# Patient Record
Sex: Male | Born: 1935 | Race: White | Hispanic: No | Smoking: Never smoker
Health system: Southern US, Community
[De-identification: ages and names within clinical notes are randomized; demographics above are authoritative.]

## PROBLEM LIST (undated history)

## (undated) DIAGNOSIS — N400 Enlarged prostate without lower urinary tract symptoms: Secondary | ICD-10-CM

## (undated) DIAGNOSIS — F0280 Dementia in other diseases classified elsewhere without behavioral disturbance: Secondary | ICD-10-CM

## (undated) DIAGNOSIS — I639 Cerebral infarction, unspecified: Secondary | ICD-10-CM

## (undated) DIAGNOSIS — G4700 Insomnia, unspecified: Secondary | ICD-10-CM

## (undated) DIAGNOSIS — E7800 Pure hypercholesterolemia, unspecified: Secondary | ICD-10-CM

## (undated) DIAGNOSIS — M1990 Unspecified osteoarthritis, unspecified site: Secondary | ICD-10-CM

## (undated) DIAGNOSIS — E46 Unspecified protein-calorie malnutrition: Secondary | ICD-10-CM

## (undated) DIAGNOSIS — G309 Alzheimer's disease, unspecified: Secondary | ICD-10-CM

## (undated) DIAGNOSIS — Z9181 History of falling: Secondary | ICD-10-CM

## (undated) DIAGNOSIS — E559 Vitamin D deficiency, unspecified: Secondary | ICD-10-CM

## (undated) DIAGNOSIS — I10 Essential (primary) hypertension: Secondary | ICD-10-CM

## (undated) DIAGNOSIS — M810 Age-related osteoporosis without current pathological fracture: Secondary | ICD-10-CM

## (undated) DIAGNOSIS — R410 Disorientation, unspecified: Secondary | ICD-10-CM

## (undated) HISTORY — DX: Disorientation, unspecified: R41.0

## (undated) HISTORY — DX: History of falling: Z91.81

## (undated) HISTORY — PX: JOINT REPLACEMENT: SHX530

## (undated) HISTORY — DX: Essential (primary) hypertension: I10

## (undated) HISTORY — DX: Vitamin D deficiency, unspecified: E55.9

## (undated) HISTORY — DX: Unspecified protein-calorie malnutrition: E46

---

## 1957-10-28 HISTORY — PX: NEPHRECTOMY: SHX65

## 1998-11-16 ENCOUNTER — Inpatient Hospital Stay (HOSPITAL_COMMUNITY): Admission: RE | Admit: 1998-11-16 | Discharge: 1998-11-22 | Payer: Self-pay | Admitting: Orthopedic Surgery

## 1999-02-28 ENCOUNTER — Ambulatory Visit (HOSPITAL_BASED_OUTPATIENT_CLINIC_OR_DEPARTMENT_OTHER): Admission: RE | Admit: 1999-02-28 | Discharge: 1999-02-28 | Payer: Self-pay | Admitting: Urology

## 2000-11-24 ENCOUNTER — Ambulatory Visit (HOSPITAL_COMMUNITY): Admission: RE | Admit: 2000-11-24 | Discharge: 2000-11-24 | Payer: Self-pay | Admitting: *Deleted

## 2003-12-28 ENCOUNTER — Ambulatory Visit (HOSPITAL_COMMUNITY): Admission: RE | Admit: 2003-12-28 | Discharge: 2003-12-28 | Payer: Self-pay | Admitting: *Deleted

## 2006-02-26 ENCOUNTER — Encounter: Payer: Self-pay | Admitting: Orthopedic Surgery

## 2011-03-27 ENCOUNTER — Ambulatory Visit (HOSPITAL_COMMUNITY): Payer: Medicare Other | Attending: Internal Medicine

## 2011-03-27 DIAGNOSIS — M810 Age-related osteoporosis without current pathological fracture: Secondary | ICD-10-CM | POA: Insufficient documentation

## 2012-07-07 ENCOUNTER — Other Ambulatory Visit: Payer: Self-pay | Admitting: Orthopedic Surgery

## 2012-07-07 DIAGNOSIS — R0989 Other specified symptoms and signs involving the circulatory and respiratory systems: Secondary | ICD-10-CM

## 2012-07-09 ENCOUNTER — Ambulatory Visit
Admission: RE | Admit: 2012-07-09 | Discharge: 2012-07-09 | Disposition: A | Discharge status: Home / Self Care | Payer: Medicare Other | Source: Ambulatory Visit | Attending: Orthopedic Surgery | Admitting: Orthopedic Surgery

## 2012-07-09 DIAGNOSIS — R0989 Other specified symptoms and signs involving the circulatory and respiratory systems: Secondary | ICD-10-CM

## 2012-07-24 ENCOUNTER — Other Ambulatory Visit (HOSPITAL_COMMUNITY): Payer: Self-pay | Admitting: *Deleted

## 2012-07-28 ENCOUNTER — Encounter (HOSPITAL_COMMUNITY): Payer: Self-pay

## 2012-07-28 ENCOUNTER — Encounter (HOSPITAL_COMMUNITY)
Admission: RE | Admit: 2012-07-28 | Discharge: 2012-07-28 | Disposition: A | Discharge status: Home / Self Care | Payer: Medicare Other | Source: Ambulatory Visit | Attending: Internal Medicine | Admitting: Internal Medicine

## 2012-07-28 DIAGNOSIS — M810 Age-related osteoporosis without current pathological fracture: Secondary | ICD-10-CM | POA: Insufficient documentation

## 2012-07-28 HISTORY — DX: Insomnia, unspecified: G47.00

## 2012-07-28 HISTORY — DX: Pure hypercholesterolemia, unspecified: E78.00

## 2012-07-28 HISTORY — DX: Age-related osteoporosis without current pathological fracture: M81.0

## 2012-07-28 MED ORDER — SODIUM CHLORIDE 0.9 % IV SOLN
Freq: Once | INTRAVENOUS | Status: AC
  Administered 2012-07-28: 13:00:00 via INTRAVENOUS

## 2012-07-28 MED ORDER — ZOLEDRONIC ACID 5 MG/100ML IV SOLN
5.0000 mg | Freq: Once | INTRAVENOUS | Status: AC
  Administered 2012-07-28: 5 mg via INTRAVENOUS
  Filled 2012-07-28: qty 100

## 2012-08-30 ENCOUNTER — Other Ambulatory Visit: Payer: Self-pay

## 2012-08-30 ENCOUNTER — Inpatient Hospital Stay (HOSPITAL_COMMUNITY)
Admission: EM | Admit: 2012-08-30 | Discharge: 2012-09-02 | DRG: 065 | Disposition: A | Discharge status: Skilled Nursing Facility | Payer: Medicare Other | Attending: Internal Medicine | Admitting: Internal Medicine

## 2012-08-30 ENCOUNTER — Emergency Department (HOSPITAL_COMMUNITY): Payer: Medicare Other

## 2012-08-30 DIAGNOSIS — I6350 Cerebral infarction due to unspecified occlusion or stenosis of unspecified cerebral artery: Principal | ICD-10-CM | POA: Diagnosis present

## 2012-08-30 DIAGNOSIS — G4700 Insomnia, unspecified: Secondary | ICD-10-CM | POA: Diagnosis present

## 2012-08-30 DIAGNOSIS — Z96659 Presence of unspecified artificial knee joint: Secondary | ICD-10-CM

## 2012-08-30 DIAGNOSIS — E785 Hyperlipidemia, unspecified: Secondary | ICD-10-CM | POA: Diagnosis present

## 2012-08-30 DIAGNOSIS — E46 Unspecified protein-calorie malnutrition: Secondary | ICD-10-CM | POA: Diagnosis present

## 2012-08-30 DIAGNOSIS — R531 Weakness: Secondary | ICD-10-CM

## 2012-08-30 DIAGNOSIS — G309 Alzheimer's disease, unspecified: Secondary | ICD-10-CM | POA: Diagnosis present

## 2012-08-30 DIAGNOSIS — M899 Disorder of bone, unspecified: Secondary | ICD-10-CM | POA: Diagnosis present

## 2012-08-30 DIAGNOSIS — E559 Vitamin D deficiency, unspecified: Secondary | ICD-10-CM | POA: Diagnosis present

## 2012-08-30 DIAGNOSIS — F0280 Dementia in other diseases classified elsewhere without behavioral disturbance: Secondary | ICD-10-CM | POA: Diagnosis present

## 2012-08-30 DIAGNOSIS — R4182 Altered mental status, unspecified: Secondary | ICD-10-CM

## 2012-08-30 DIAGNOSIS — N400 Enlarged prostate without lower urinary tract symptoms: Secondary | ICD-10-CM | POA: Diagnosis present

## 2012-08-30 DIAGNOSIS — M1710 Unilateral primary osteoarthritis, unspecified knee: Secondary | ICD-10-CM | POA: Diagnosis present

## 2012-08-30 DIAGNOSIS — Z66 Do not resuscitate: Secondary | ICD-10-CM | POA: Diagnosis present

## 2012-08-30 DIAGNOSIS — I10 Essential (primary) hypertension: Secondary | ICD-10-CM | POA: Diagnosis present

## 2012-08-30 HISTORY — DX: Unspecified osteoarthritis, unspecified site: M19.90

## 2012-08-30 LAB — CBC
HCT: 42.5 % (ref 39.0–52.0)
MCV: 92.6 fL (ref 78.0–100.0)
RBC: 4.59 MIL/uL (ref 4.22–5.81)
RDW: 13 % (ref 11.5–15.5)
WBC: 10.6 10*3/uL — ABNORMAL HIGH (ref 4.0–10.5)

## 2012-08-30 LAB — BASIC METABOLIC PANEL
BUN: 14 mg/dL (ref 6–23)
CO2: 23 mEq/L (ref 19–32)
Chloride: 97 mEq/L (ref 96–112)
Creatinine, Ser: 0.82 mg/dL (ref 0.50–1.35)
GFR calc Af Amer: 87 mL/min — ABNORMAL LOW (ref >90)
Potassium: 3.5 mEq/L (ref 3.5–5.1)

## 2012-08-30 LAB — GLUCOSE, CAPILLARY: Glucose-Capillary: 108 mg/dL — ABNORMAL HIGH (ref 70–99)

## 2012-08-30 LAB — URINALYSIS, ROUTINE W REFLEX MICROSCOPIC
Glucose, UA: NEGATIVE mg/dL
Ketones, ur: 15 mg/dL — AB
Leukocytes, UA: NEGATIVE
Nitrite: NEGATIVE
Specific Gravity, Urine: 1.021 (ref 1.005–1.030)
pH: 6 (ref 5.0–8.0)

## 2012-08-30 LAB — RAPID URINE DRUG SCREEN, HOSP PERFORMED: Barbiturates: NOT DETECTED

## 2012-08-30 MED ORDER — SODIUM CHLORIDE 0.9 % IV BOLUS (SEPSIS)
1000.0000 mL | Freq: Once | INTRAVENOUS | Status: AC
  Administered 2012-08-31: 1000 mL via INTRAVENOUS

## 2012-08-30 MED ORDER — SODIUM CHLORIDE 0.9 % IV SOLN
INTRAVENOUS | Status: DC
  Administered 2012-08-31: 1000 mL via INTRAVENOUS
  Administered 2012-09-01 – 2012-09-02 (×2): via INTRAVENOUS

## 2012-08-30 NOTE — ED Notes (Signed)
ZOX:[id number]<AV> Expected date:08/30/12<BR> Expected time: 7:55 PM<BR> Means of arrival:Ambulance<BR> Comments:<BR> Found outside Altered LOC 78 yo M

## 2012-08-30 NOTE — ED Notes (Signed)
Pt BIB EMS. Pt found in front yard laying down for unknown amount of time. Bystander called EMS. Pt a/o x 3, but doesn't know why he is outside per EMS. No neuro deficits per EMS. Pt has had some episodes of hallucinations since Wed. Pt has been seeing ants on the wall and having conversations with deceased family members. Pt lives alone. Pt arrives with son.

## 2012-08-30 NOTE — ED Notes (Signed)
MD at bedside. Dr. Bednar at bedside.  

## 2012-08-30 NOTE — ED Provider Notes (Signed)
History     CSN: [id number]  Arrival date & time 08/30/12  2033   First MD Initiated Contact with Patient 08/30/12 2302      Chief Complaint  Patient presents with  . Altered Mental Status   PCP: [facility]  (Consider location/radiation/quality/duration/timing/severity/associated sxs/prior treatment) HPI This 78 year old male lives with family at home, at baseline the patient has generalized weakness and uses a walker, for the last few weeks he is gradually become weaker than usual and has had several falls with no apparent trauma, over the last 5 days he has had new onset confusion with intermittent hallucinations and delusions thinking that people are alive and bugs are crawling on the wall when they're not there, he is no suicidal or homicidal ideation or depression, he is no headache, he is no change in vision swallowing or understanding, he is no lateralizing weakness or numbness or incoordination, he is no cough shortness breath chest pain abdominal pain vomiting or diarrhea. Today his son was at work and came home and the patient was found outside and had been outside for an unknown period of time and the patient had amnesia for what had gone on today. He denies any pain. Tonight while in the emergency department the patient has been generally weak but at 2300 apparently had clear speech prior to his CT scan of his head, upon return back to the room the patient seems to have slightly slurred speech according to the family, but has no headache no change in mental status he is oriented to person and place but not to time but he has been disoriented to time for the last 4 or 5 days now, he still has no lateralizing or focal neurologic deficits.  Since the patient was found earlier today he has been too weak to walk on his own. Past Medical History  Diagnosis Date  . High cholesterol   . Insomnia   . Osteoporosis   . Osteoarthritis     Past Surgical History  Procedure Date  . Nephrectomy  1959    rt, but doesn't remember why  . Joint replacement     left knee    History reviewed. No pertinent family history.  History  Substance Use Topics  . Smoking status: Never Smoker   . Smokeless tobacco: Never Used  . Alcohol Use: No      Review of Systems 10 Systems reviewed and are negative for acute change except as noted in the HPI. Allergies  Review of patient's allergies indicates no known allergies.  Home Medications   No current outpatient prescriptions on file.  BP 130/79  Pulse 85  Temp 97.7 F (36.5 C) (Axillary)  Resp 18  Ht 5\' 4"  (1.626 m)  Wt 124 lb 8 oz (56.473 kg)  BMI 21.37 kg/m2  SpO2 95%  Physical Exam  Nursing note and vitals reviewed. Constitutional:       Awake, alert, nontoxic appearance with speech slightly slurred for patient.  HENT:  Head: Atraumatic.  Mouth/Throat: No oropharyngeal exudate.  Eyes: EOM are normal. Pupils are equal, round, and reactive to light. Right eye exhibits no discharge. Left eye exhibits no discharge.  Neck: Neck supple.  Cardiovascular: Normal rate and regular rhythm.   No murmur heard. Pulmonary/Chest: Effort normal and breath sounds normal. No stridor. No respiratory distress. He has no wheezes. He has no rales. He exhibits no tenderness.  Abdominal: Soft. Bowel sounds are normal. He exhibits no mass. There is no tenderness. There is no  rebound.  Musculoskeletal: He exhibits no tenderness.       Baseline ROM, moves extremities with no obvious new focal weakness.  Lymphadenopathy:    He has no cervical adenopathy.  Neurological: He is alert.       Awake, alert, cooperative oriented to person and place but not time; motor strength 4/5 bilaterally; sensation normal to light touch bilaterally; peripheral visual fields full to confrontation; no facial asymmetry; tongue midline; major cranial nerves appear intact; no pronator drift arms or legs, normal finger to nose bilaterally, too weak to sit unassisted or  stand  Skin: No rash noted.  Psychiatric: He has a normal mood and affect.    ED Course  Procedures (including critical care time) ECG: Sinus tachycardia, ventricular 108, left axis deviation, left anterior fascicular block, premature ventricular complexes, no acute ischemic changes noted, compared with 2000 axis is shifted leftward  D/w family cannot r/o CVA with minimal dysarthria vs dry mouth with speech almost normal after CT resulted, family does not want [facility] or [facility] even if CVA, family comfortable with and prefers admit at [facility] by PCP. 0040  D/w Perini for admit.  Family states Pt would want DNR/DNI (Pt sleeping now).0140 Labs Reviewed  CBC - Abnormal; Notable for the following:    WBC 10.6 (*)     All other components within normal limits  BASIC METABOLIC PANEL - Abnormal; Notable for the following:    Sodium 132 (*)     Glucose, Bld 105 (*)     GFR calc non Af Amer 75 (*)     GFR calc Af Amer 87 (*)     All other components within normal limits  URINALYSIS, ROUTINE W REFLEX MICROSCOPIC - Abnormal; Notable for the following:    Ketones, ur 15 (*)     All other components within normal limits  HEPATIC FUNCTION PANEL - Abnormal; Notable for the following:    Albumin 3.1 (*)     All other components within normal limits  GLUCOSE, CAPILLARY - Abnormal; Notable for the following:    Glucose-Capillary 108 (*)     All other components within normal limits  LIPID PANEL - Abnormal; Notable for the following:    LDL Cholesterol 100 (*)     All other components within normal limits  PROTIME-INR  APTT  TROPONIN I  URINE RAPID DRUG SCREEN (HOSP PERFORMED)  ETHANOL  LACTIC ACID, PLASMA  TSH  VITAMIN B12  RPR  HEMOGLOBIN A1C  COMPREHENSIVE METABOLIC PANEL  CBC   Ct Head Wo Contrast  08/31/2012  *RADIOLOGY REPORT*  Clinical Data: Altered mental status.  CT HEAD WITHOUT CONTRAST  Technique:  Contiguous axial images were obtained from the base of the skull through the  vertex without contrast.  Comparison: None.  Findings: There is no evidence of acute infarction, mass lesion, or intra- or extra-axial hemorrhage on CT.  Prominence of the ventricles and sulci reflects moderate cortical volume loss.  Cerebellar atrophy is noted.  Diffuse periventricular and subcortical white matter change likely reflects small vessel ischemic microangiopathy.  The brainstem and fourth ventricle are within normal limits.  The basal ganglia are unremarkable in appearance.  The cerebral hemispheres demonstrate grossly normal gray-white differentiation. No mass effect or midline shift is seen.  There is no evidence of fracture; visualized osseous structures are unremarkable in appearance.  The orbits are within normal limits. The paranasal sinuses and mastoid air cells are well-aerated.  No significant soft tissue abnormalities are seen.  IMPRESSION:  1.  No acute intracranial pathology seen on CT. 2.  Moderate cortical volume loss and diffuse small vessel ischemic microangiopathy.   Original Report Authenticated By: Tonia Ghent, M.D.    Mr Brain Wo Contrast  08/31/2012  *RADIOLOGY REPORT*  Clinical Data: Altered mental status.  Weakness.  Hyperglycemia.  MRI HEAD WITHOUT CONTRAST  Technique:  Multiplanar, multiecho pulse sequences of the brain and surrounding structures were obtained according to standard protocol without intravenous contrast.  Comparison: Head CT same day  Findings: There is a punctate acute infarction within the right posteromedial temporal lobe.  No other acute infarction.  There are extensive chronic small vessel changes affecting the pons.  There are old small vessel infarctions within the cerebellum.  The cerebral hemispheres show generalized atrophy with patchy and confluent chronic small vessel disease throughout the white matter. No large vessel territory infarction.  No mass lesion, hemorrhage, hydrocephalus or extra-axial collection.  No pituitary mass.  No inflammatory  sinus disease.  No skull or skull base lesion.  IMPRESSION: Punctate acute infarction within the posterior temporal lobe on the right.  Advanced and extensive chronic small vessel disease elsewhere throughout the brain.   Original Report Authenticated By: Paulina Fusi, M.D.    Dg Chest Port 1 View  08/30/2012  *RADIOLOGY REPORT*  Clinical Data: Acute mental status changes.  Hyperglycemia. Leukocytosis.  PORTABLE CHEST - 1 VIEW 08/30/2012 2336 hours:  Comparison: None.  Findings: Cardiac silhouette enlarged, even allowing for the AP portable technique.  Thoracic aorta mildly atherosclerotic.  Hilar and mediastinal contours otherwise unremarkable.  Linear scar or atelectasis at the left lung base.  Lungs otherwise clear.  No visible pleural effusions.  IMPRESSION: Mild cardiomegaly.  Linear atelectasis or scar at the left lung base.  No acute cardiopulmonary disease otherwise.   Original Report Authenticated By: Hulan Saas, M.D.      1. Altered mental state   2. Weakness       MDM  Pt stable in ED with no significant deterioration in condition.  Patient / Family / Caregiver informed of clinical course, understand medical decision-making process, and agree with plan.       Hurman Horn, MD 08/31/12 [phone]

## 2012-08-30 NOTE — ED Notes (Signed)
Pt alert, oriented. Pt confused about situation today. Family members at bedside. Pt denies pain. Responds appropriately to questions.

## 2012-08-30 NOTE — ED Notes (Signed)
Patient receiving portable  X-ray

## 2012-08-31 ENCOUNTER — Encounter (HOSPITAL_COMMUNITY): Payer: Self-pay

## 2012-08-31 ENCOUNTER — Inpatient Hospital Stay (HOSPITAL_COMMUNITY): Payer: Medicare Other

## 2012-08-31 DIAGNOSIS — R4182 Altered mental status, unspecified: Secondary | ICD-10-CM

## 2012-08-31 DIAGNOSIS — I6350 Cerebral infarction due to unspecified occlusion or stenosis of unspecified cerebral artery: Principal | ICD-10-CM

## 2012-08-31 LAB — PROTIME-INR: INR: 0.99 (ref 0.00–1.49)

## 2012-08-31 LAB — TROPONIN I: Troponin I: <0.3 ng/mL (ref <0.30)

## 2012-08-31 LAB — LIPID PANEL
LDL Cholesterol: 100 mg/dL — ABNORMAL HIGH (ref 0–99)
Total CHOL/HDL Ratio: 3.2 RATIO
VLDL: 12 mg/dL (ref 0–40)

## 2012-08-31 LAB — ETHANOL: Alcohol, Ethyl (B): <11 mg/dL (ref 0–11)

## 2012-08-31 LAB — HEPATIC FUNCTION PANEL
ALT: 12 U/L (ref 0–53)
AST: 18 U/L (ref 0–37)
Albumin: 3.1 g/dL — ABNORMAL LOW (ref 3.5–5.2)
Total Protein: 6.2 g/dL (ref 6.0–8.3)

## 2012-08-31 LAB — APTT: aPTT: 31 seconds (ref 24–37)

## 2012-08-31 LAB — LACTIC ACID, PLASMA: Lactic Acid, Venous: 1.3 mmol/L (ref 0.5–2.2)

## 2012-08-31 LAB — TSH: TSH: 4.109 u[IU]/mL (ref 0.350–4.500)

## 2012-08-31 MED ORDER — SODIUM CHLORIDE 0.9 % IV SOLN: 250.0000 mL | INTRAVENOUS | Status: DC | PRN

## 2012-08-31 MED ORDER — ASPIRIN 325 MG PO TABS
325.0000 mg | ORAL_TABLET | Freq: Every day | ORAL | Status: DC
  Filled 2012-08-31: qty 1

## 2012-08-31 MED ORDER — SODIUM CHLORIDE 0.9 % IJ SOLN: 3.0000 mL | Freq: Two times a day (BID) | INTRAMUSCULAR | Status: DC

## 2012-08-31 MED ORDER — ACETAMINOPHEN 325 MG PO TABS
650.0000 mg | ORAL_TABLET | Freq: Four times a day (QID) | ORAL | Status: DC | PRN
  Administered 2012-08-31: 650 mg via ORAL
  Filled 2012-08-31: qty 2

## 2012-08-31 MED ORDER — SODIUM CHLORIDE 0.9 % IJ SOLN: 3.0000 mL | INTRAMUSCULAR | Status: DC | PRN

## 2012-08-31 MED ORDER — DEXTROSE-NACL 5-0.45 % IV SOLN
INTRAVENOUS | Status: DC
  Administered 2012-08-31: 15:00:00 via INTRAVENOUS

## 2012-08-31 MED ORDER — DOXAZOSIN MESYLATE 4 MG PO TABS
4.0000 mg | ORAL_TABLET | Freq: Every day | ORAL | Status: DC
  Administered 2012-09-01: 4 mg via ORAL
  Filled 2012-08-31 (×3): qty 1

## 2012-08-31 MED ORDER — ONDANSETRON HCL 4 MG PO TABS: 4.0000 mg | ORAL_TABLET | Freq: Four times a day (QID) | ORAL | Status: DC | PRN

## 2012-08-31 MED ORDER — SENNOSIDES-DOCUSATE SODIUM 8.6-50 MG PO TABS
1.0000 | ORAL_TABLET | Freq: Every evening | ORAL | Status: DC | PRN
  Filled 2012-08-31: qty 1

## 2012-08-31 MED ORDER — ZOLPIDEM TARTRATE 5 MG PO TABS
5.0000 mg | ORAL_TABLET | Freq: Every evening | ORAL | Status: DC | PRN
  Administered 2012-08-31: 5 mg via ORAL
  Filled 2012-08-31: qty 1

## 2012-08-31 MED ORDER — MEMANTINE HCL 5 MG PO TABS
5.0000 mg | ORAL_TABLET | Freq: Every day | ORAL | Status: DC
  Administered 2012-08-31 – 2012-09-02 (×3): 5 mg via ORAL
  Filled 2012-08-31 (×3): qty 1

## 2012-08-31 MED ORDER — INFLUENZA VIRUS VACC SPLIT PF IM SUSP
0.5000 mL | INTRAMUSCULAR | Status: AC
  Administered 2012-09-01: 0.5 mL via INTRAMUSCULAR
  Filled 2012-08-31: qty 0.5

## 2012-08-31 MED ORDER — ATORVASTATIN CALCIUM 20 MG PO TABS
20.0000 mg | ORAL_TABLET | Freq: Every day | ORAL | Status: DC
  Administered 2012-08-31 – 2012-09-02 (×3): 20 mg via ORAL
  Filled 2012-08-31 (×3): qty 1

## 2012-08-31 MED ORDER — LORAZEPAM 2 MG/ML IJ SOLN
1.0000 mg | Freq: Once | INTRAMUSCULAR | Status: AC
  Administered 2012-08-31: 1 mg via INTRAVENOUS
  Filled 2012-08-31: qty 1

## 2012-08-31 MED ORDER — ASPIRIN EC 81 MG PO TBEC
81.0000 mg | DELAYED_RELEASE_TABLET | Freq: Every day | ORAL | Status: DC
  Administered 2012-09-01 – 2012-09-02 (×2): 81 mg via ORAL
  Filled 2012-08-31 (×2): qty 1

## 2012-08-31 MED ORDER — ONDANSETRON HCL 4 MG/2ML IJ SOLN: 4.0000 mg | Freq: Four times a day (QID) | INTRAMUSCULAR | Status: DC | PRN

## 2012-08-31 MED ORDER — ALUM & MAG HYDROXIDE-SIMETH 200-200-20 MG/5ML PO SUSP
30.0000 mL | Freq: Four times a day (QID) | ORAL | Status: DC | PRN
Start: 2012-08-31 — End: 2012-09-02

## 2012-08-31 MED ORDER — DEXTROSE-NACL 5-0.45 % IV SOLN
INTRAVENOUS | Status: AC
  Administered 2012-08-31: 03:00:00 via INTRAVENOUS

## 2012-08-31 MED ORDER — ACETAMINOPHEN 650 MG RE SUPP: 650.0000 mg | Freq: Four times a day (QID) | RECTAL | Status: DC | PRN

## 2012-08-31 NOTE — Progress Notes (Signed)
08-31-12-0315  Pt has just arrived on floor and it was reported to me from ED nurse that pt failed his swallowing study and should be made NPO however order in computer is heart healthy.  I will keep pt NPO until MD clarifies in AM.  Please also clarify IVF there are 2 in computer at this time and I have the latest one hanging.  D5% 1/2ns at 100 cc/hr.  Pt remains very pleasantly confused, trying to follow commands, unable to answer a direct question, answers do not match the conversation, PERRLA, grips and pedal pushes are equal, no slurred speech, vss, bed alarm is on.  Will continue to monitor.

## 2012-08-31 NOTE — Progress Notes (Signed)
Clinical Social Work Department BRIEF PSYCHOSOCIAL ASSESSMENT 08/31/2012  Patient:  Craig White, Craig White     Account Number:  [id number]     Admit date:  08/30/2012  Clinical Social Worker:  Orpah Greek  Date/Time:  08/31/2012 03:13 PM  Referred by:  Physician  Date Referred:  08/31/2012 Referred for  SNF Placement   Other Referral:   Interview type:  Family Other interview type:    PSYCHOSOCIAL DATA Living Status:  FAMILY Admitted from facility:   Level of care:   Primary support name:  Jerilee Field (daughter) h#: [phone] c#: [phone] Primary support relationship to patient:  CHILD, ADULT Degree of support available:   good    CURRENT CONCERNS Current Concerns  Post-Acute Placement   Other Concerns:    SOCIAL WORK ASSESSMENT / PLAN CSW spoke with patient's daughter, Harriett Sine & son-in-law, Ronnie at bedside re: discharge planning. Patient had been living with his son, [name] who works during the day leaving patient alone. Patient is needing SNF/Memory care unit.   Assessment/plan status:  Information/Referral to [facility] Other assessment/ plan:   Information/referral to community resources:   CSW completed FL2 and faxed information out to [facility] - daughter expressed interest in [facility] [place]. CSW made [name] @ [facility] aware, awaiting call back from her re: bed offer/availability.    PATIENT'S/FAMILY'S RESPONSE TO PLAN OF CARE: Patient's family were agreeable with plan for SNF/Memory care unit.        Unice Bailey, LCSW [facility] Clinical Social Worker cell #: [phone]

## 2012-08-31 NOTE — Progress Notes (Signed)
08-31-12 1610 NSG:  Noted that Dr Wylene Simmer has just placed some orders for this pt - I needed some clarification re: diet, IVF and code status and called MD on call who was Dr Waynard Edwards and explained situation.  He said to keept pt NPO until Dr Wylene Simmer comes in, continue D5 1/2 NS at 100 and full code status until Dr Wylene Simmer addresses.  And states Dr Wylene Simmer cell number today is [phone] - states Dr Wylene Simmer is probably coming in very soon but if he doesn't come in by ( then call him for clarification of these issues)

## 2012-08-31 NOTE — Consult Note (Signed)
NEURO HOSPITALIST CONSULT NOTE    Reason for Consult:  Speech deficit  HPI:                                                                                                                                          Craig White is an 78 y.o. male Who has history of dementia, lives with his son.  He was last seen normal yesterday at 11 am before his son left for work.  When his sone returned from work he was found on the side of the  Street confused and generally weak. He was also noted to have expressive difficulties and slurred speech.  This improved slightly by the time they arrived at the hospital but while in CT scanner his speech again became dysarthric. Over night his speech has resolved and did very well with speech therapy.  He had no lateralizing weakness.   Of note: 1 month ago patient was started on Amitriptyline for pain.  About one week ago patient was noted to have visual hallucinations --more than his baseline. As patient has a known Dx of dementia, the initiation of this drug likely worsened his symptoms of dementia.   During exam patient is alert but not oriented to date, year, hospital or even his own birth date.  He can recall 8 vegetables in 1 minute, can spell WORLD forward but only get DL--- backward.  No problems counting 25+10+5+1 = 41. Able to name objects.   Past Medical History  Diagnosis Date  . High cholesterol   . Insomnia   . Osteoporosis   . Osteoarthritis     Past Surgical History  Procedure Date  . Nephrectomy 1959    rt, but doesn't remember why  . Joint replacement     left knee    Family History: Mother stroke Father: died when patient was 12 Siblings--Alzhiemers, broken hip, cancer, stroke  Social History:  reports that he has never smoked. He has never used smokeless tobacco. He reports that he does not drink alcohol or use illicit drugs.  No Known Allergies  MEDICATIONS:  Prior to Admission:  Prescriptions prior to admission  Medication Sig Dispense Refill  . amitriptyline (ELAVIL) 10 MG tablet Take 10 mg by mouth at bedtime.      Marland Kitchen atorvastatin (LIPITOR) 20 MG tablet Take 20 mg by mouth daily.      . Calcium-Vitamin D (CALTRATE 600 PLUS-VIT D PO) Take by mouth.      . doxazosin (CARDURA) 4 MG tablet Take 4 mg by mouth at bedtime.      . ergocalciferol (VITAMIN D2) 50000 UNITS capsule Take 50,000 Units by mouth once a week.      . hydrochlorothiazide (HYDRODIURIL) 12.5 MG tablet Take 12.5 mg by mouth daily.      . zoledronic acid (RECLAST) 5 MG/100ML SOLN Inject 5 mg into the vein once.       Scheduled:   . aspirin  325 mg Oral Daily  . atorvastatin  20 mg Oral Daily  . [COMPLETED] dextrose 5 % and 0.45% NaCl   Intravenous STAT  . doxazosin  4 mg Oral QHS  . influenza  inactive virus vaccine  0.5 mL Intramuscular Tomorrow-1000  . [COMPLETED] LORazepam  1 mg Intravenous Once  . [COMPLETED] sodium chloride  1,000 mL Intravenous Once  . [DISCONTINUED] sodium chloride  3 mL Intravenous Q12H  . [DISCONTINUED] sodium chloride  3 mL Intravenous Q12H     ROS:                                                                                                                                       History obtained from child and the patient  General ROS: negative for - chills, fatigue, fever, night sweats, weight gain or weight loss Psychological ROS: negative for - behavioral disorder, hallucinations, memory difficulties, mood swings or suicidal ideation Ophthalmic ROS: negative for - blurry vision, double vision, eye pain or loss of vision ENT ROS: negative for - epistaxis, nasal discharge, oral lesions, sore throat, tinnitus or vertigo Allergy and Immunology ROS: negative for - hives or itchy/watery eyes Hematological and Lymphatic ROS: negative for - bleeding problems, bruising or swollen lymph  nodes Endocrine ROS: negative for - galactorrhea, hair pattern changes, polydipsia/polyuria or temperature intolerance Respiratory ROS: negative for - cough, hemoptysis, shortness of breath or wheezing Cardiovascular ROS: negative for - chest pain, dyspnea on exertion, edema or irregular heartbeat Gastrointestinal ROS: negative for - abdominal pain, diarrhea, hematemesis, nausea/vomiting or stool incontinence Genito-Urinary ROS: negative for - dysuria, hematuria, incontinence or urinary frequency/urgency Musculoskeletal ROS: See HPI Neurological ROS: as noted in HPI Dermatological ROS: negative for rash and skin lesion changes   Blood pressure 130/79, pulse 85, temperature 97.7 F (36.5 C), temperature source Axillary, resp. rate 18, height 5\' 4"  (1.626 m), weight 56.473 kg (124 lb 8 oz), SpO2 95.00%.   Neurologic Examination:  Mental Status: During exam patient is alert but not oriented to date, year, hospital or even his own birth date.  He can recall 8 vegetables in 1 minute, can spell WORLD forward but only get DL--- backward.  No problems counting 25+10+5+1 = 41. Able to name objects. Unable to interpret proverbs.  Cranial Nerves: II: Discs flat bilaterally; Visual fields grossly normal, pupils equal, round, reactive to light and accommodation III,IV, VI: ptosis not present, extra-ocular motions intact bilaterally V,VII: smile symmetric, facial light touch sensation normal bilaterally VIII: hearing severely decreased at baseline IX,X: gag reflex present XI: bilateral shoulder shrug XII: midline tongue extension Motor: 4/5 throughgout with slightly weaker in right leg due to right knee arthritis.   Tone and bulk:normal tone throughout; no atrophy noted Sensory: Pinprick and light touch intact throughout, bilaterally Deep Tendon Reflexes: 2+ UE bilaterally, 1+ KJ bilaterally, depressed  AJ bilaterally Plantars: Right: downgoing   Left: downgoing Cerebellar: normal finger-to-nose,   CV: pulses palpable throughout     Lab Results  Component Value Date/Time   CHOL 164 08/31/2012  6:43 AM    Results for orders placed during the hospital encounter of 08/30/12 (from the past 48 hour(s))  CBC     Status: Abnormal   Collection Time   08/30/12  9:32 PM      Component Value Range Comment   WBC 10.6 (*) 4.0 - 10.5 K/uL    RBC 4.59  4.22 - 5.81 MIL/uL    Hemoglobin 14.5  13.0 - 17.0 g/dL    HCT 16.1  09.6 - 04.5 %    MCV 92.6  78.0 - 100.0 fL    MCH 31.6  26.0 - 34.0 pg    MCHC 34.1  30.0 - 36.0 g/dL    RDW 40.9  81.1 - 91.4 %    Platelets 269  150 - 400 K/uL   BASIC METABOLIC PANEL     Status: Abnormal   Collection Time   08/30/12  9:32 PM      Component Value Range Comment   Sodium 132 (*) 135 - 145 mEq/L    Potassium 3.5  3.5 - 5.1 mEq/L    Chloride 97  96 - 112 mEq/L    CO2 23  19 - 32 mEq/L    Glucose, Bld 105 (*) 70 - 99 mg/dL    BUN 14  6 - 23 mg/dL    Creatinine, Ser 7.82  0.50 - 1.35 mg/dL    Calcium 8.9  8.4 - 95.6 mg/dL    GFR calc non Af Amer 75 (*) >90 mL/min    GFR calc Af Amer 87 (*) >90 mL/min   URINALYSIS, ROUTINE W REFLEX MICROSCOPIC     Status: Abnormal   Collection Time   08/30/12 10:29 PM      Component Value Range Comment   Color, Urine YELLOW  YELLOW    APPearance CLEAR  CLEAR    Specific Gravity, Urine 1.021  1.005 - 1.030    pH 6.0  5.0 - 8.0    Glucose, UA NEGATIVE  NEGATIVE mg/dL    Hgb urine dipstick NEGATIVE  NEGATIVE    Bilirubin Urine NEGATIVE  NEGATIVE    Ketones, ur 15 (*) NEGATIVE mg/dL    Protein, ur NEGATIVE  NEGATIVE mg/dL    Urobilinogen, UA 1.0  0.0 - 1.0 mg/dL    Nitrite NEGATIVE  NEGATIVE    Leukocytes, UA NEGATIVE  NEGATIVE MICROSCOPIC NOT DONE ON URINES WITH NEGATIVE PROTEIN, BLOOD, LEUKOCYTES,  NITRITE, OR GLUCOSE <1000 mg/dL.  URINE RAPID DRUG SCREEN (HOSP PERFORMED)     Status: Normal   Collection Time   08/30/12  10:29 PM      Component Value Range Comment   Opiates NONE DETECTED  NONE DETECTED    Cocaine NONE DETECTED  NONE DETECTED    Benzodiazepines NONE DETECTED  NONE DETECTED    Amphetamines NONE DETECTED  NONE DETECTED    Tetrahydrocannabinol NONE DETECTED  NONE DETECTED    Barbiturates NONE DETECTED  NONE DETECTED   GLUCOSE, CAPILLARY     Status: Abnormal   Collection Time   08/30/12 11:39 PM      Component Value Range Comment   Glucose-Capillary 108 (*) 70 - 99 mg/dL    Comment 1 Notify RN     PROTIME-INR     Status: Normal   Collection Time   08/31/12 12:15 AM      Component Value Range Comment   Prothrombin Time 13.0  11.6 - 15.2 seconds    INR 0.99  0.00 - 1.49   APTT     Status: Normal   Collection Time   08/31/12 12:15 AM      Component Value Range Comment   aPTT 31  24 - 37 seconds   TROPONIN I     Status: Normal   Collection Time   08/31/12 12:15 AM      Component Value Range Comment   Troponin I <0.30  <0.30 ng/mL   ETHANOL     Status: Normal   Collection Time   08/31/12 12:15 AM      Component Value Range Comment   Alcohol, Ethyl (B) <11  0 - 11 mg/dL   HEPATIC FUNCTION PANEL     Status: Abnormal   Collection Time   08/31/12 12:15 AM      Component Value Range Comment   Total Protein 6.2  6.0 - 8.3 g/dL    Albumin 3.1 (*) 3.5 - 5.2 g/dL    AST 18  0 - 37 U/L    ALT 12  0 - 53 U/L    Alkaline Phosphatase 83  39 - 117 U/L    Total Bilirubin 0.4  0.3 - 1.2 mg/dL    Bilirubin, Direct <8.2  0.0 - 0.3 mg/dL    Indirect Bilirubin NOT CALCULATED  0.3 - 0.9 mg/dL   LACTIC ACID, PLASMA     Status: Normal   Collection Time   08/31/12 12:15 AM      Component Value Range Comment   Lactic Acid, Venous 1.3  0.5 - 2.2 mmol/L   TSH     Status: Normal   Collection Time   08/31/12  6:43 AM      Component Value Range Comment   TSH 4.109  0.350 - 4.500 uIU/mL   VITAMIN B12     Status: Normal   Collection Time   08/31/12  6:43 AM      Component Value Range Comment   Vitamin B-12  417  211 - 911 pg/mL   RPR     Status: Normal   Collection Time   08/31/12  6:43 AM      Component Value Range Comment   RPR NON REACTIVE  NON REACTIVE   LIPID PANEL     Status: Abnormal   Collection Time   08/31/12  6:43 AM      Component Value Range Comment   Cholesterol 164  0 - 200 mg/dL  Triglycerides 62  <150 mg/dL    HDL 52  >16 mg/dL    Total CHOL/HDL Ratio 3.2      VLDL 12  0 - 40 mg/dL    LDL Cholesterol 109 (*) 0 - 99 mg/dL     Ct Head Wo Contrast  08/31/2012  *RADIOLOGY REPORT*  Clinical Data: Altered mental status.  CT HEAD WITHOUT CONTRAST  Technique:  Contiguous axial images were obtained from the base of the skull through the vertex without contrast.  Comparison: None.  Findings: There is no evidence of acute infarction, mass lesion, or intra- or extra-axial hemorrhage on CT.  Prominence of the ventricles and sulci reflects moderate cortical volume loss.  Cerebellar atrophy is noted.  Diffuse periventricular and subcortical white matter change likely reflects small vessel ischemic microangiopathy.  The brainstem and fourth ventricle are within normal limits.  The basal ganglia are unremarkable in appearance.  The cerebral hemispheres demonstrate grossly normal gray-white differentiation. No mass effect or midline shift is seen.  There is no evidence of fracture; visualized osseous structures are unremarkable in appearance.  The orbits are within normal limits. The paranasal sinuses and mastoid air cells are well-aerated.  No significant soft tissue abnormalities are seen.  IMPRESSION:  1.  No acute intracranial pathology seen on CT. 2.  Moderate cortical volume loss and diffuse small vessel ischemic microangiopathy.   Original Report Authenticated By: Tonia Ghent, M.D.    Mr Brain Wo Contrast  08/31/2012  *RADIOLOGY REPORT*  Clinical Data: Altered mental status.  Weakness.  Hyperglycemia.  MRI HEAD WITHOUT CONTRAST  Technique:  Multiplanar, multiecho pulse sequences of the  brain and surrounding structures were obtained according to standard protocol without intravenous contrast.  Comparison: Head CT same day  Findings: There is a punctate acute infarction within the right posteromedial temporal lobe.  No other acute infarction.  There are extensive chronic small vessel changes affecting the pons.  There are old small vessel infarctions within the cerebellum.  The cerebral hemispheres show generalized atrophy with patchy and confluent chronic small vessel disease throughout the white matter. No large vessel territory infarction.  No mass lesion, hemorrhage, hydrocephalus or extra-axial collection.  No pituitary mass.  No inflammatory sinus disease.  No skull or skull base lesion.  IMPRESSION: Punctate acute infarction within the posterior temporal lobe on the right.  Advanced and extensive chronic small vessel disease elsewhere throughout the brain.   Original Report Authenticated By: Paulina Fusi, M.D.    Dg Chest Port 1 View  08/30/2012  *RADIOLOGY REPORT*  Clinical Data: Acute mental status changes.  Hyperglycemia. Leukocytosis.  PORTABLE CHEST - 1 VIEW 08/30/2012 2336 hours:  Comparison: None.  Findings: Cardiac silhouette enlarged, even allowing for the AP portable technique.  Thoracic aorta mildly atherosclerotic.  Hilar and mediastinal contours otherwise unremarkable.  Linear scar or atelectasis at the left lung base.  Lungs otherwise clear.  No visible pleural effusions.  IMPRESSION: Mild cardiomegaly.  Linear atelectasis or scar at the left lung base.  No acute cardiopulmonary disease otherwise.   Original Report Authenticated By: Hulan Saas, M.D.      Assessment/Plan: 78 YO male with known Dementia started on amitriptyline one month ago.  Over past week he has been noted to have increased hallucinations and now found outside on the curb by his house.  Initial symptoms included dysarthria and expressive aphasia. MRI shows a small stroke in the right parietal  region. This stroke is very small and not likely the cause of  his symptoms.  That said, given he is left handed, it is very likely he had a larger sized infarct that resolved showing only the residual infarct seen on MRI -- This would explain his symptoms prior to hospitalization. Carotid dopplers are negative and LDL 100. Amitryptiline can certainly worsen dementia as can any anticholinergic medication.    1) Would consider starting either Aricept or Namenda. He may benefit from a combination, but would add one at a time so that if side effects occur it is clear which one causes it.  2) Agree with cessation of amitriptyline  3) Continue ASA daily. Would not change his dose of Lipitor given that he is at the threshold.  4) Consider SNF placement 5) Follow up out patient with Neurology for further medication management  Neurology will S/O  Felicie Morn PA-C [facility] [phone]  08/31/2012, 4:09 PM  I have seen and evaluated the patient. I have reviewed the above note and made appropriate changes. Dementia with transient episode of dysarthria/aphasia that mostly resolved. I suspect a larger area of ischemic brain that resolved except for the tiny remaining infarct. I do not think that it caused his presentation. Amitriptyline can certainly worsen confusion in demented patients, adn agree with stopping it. Other recommendations as above.   Ritta Slot, MD [facility] [phone]  If 7pm- 7am, please page neurology on call at [phone].

## 2012-08-31 NOTE — Progress Notes (Signed)
CSW awaiting call back from [facility] re: bed offer/availability. Encouraged daughter & son-in-law to tour facility this afternoon.   Clinical Social Work Department CLINICAL SOCIAL WORK PLACEMENT NOTE 08/31/2012  Patient:  Craig White, Craig White  Account Number:  [id number] Admit date:  08/30/2012  Clinical Social Worker:  Orpah Greek  Date/time:  08/31/2012 03:17 PM  Clinical Social Work is seeking post-discharge placement for this patient at the following level of care:   SKILLED NURSING   (*CSW will update this form in Epic as items are completed)   08/31/2012  Patient/family provided with [facility] Department of Clinical Social Work's list of facilities offering this level of care within the geographic area requested by the patient (or if unable, by the patient's family).  08/31/2012  Patient/family informed of their freedom to choose among providers that offer the needed level of care, that participate in Medicare, Medicaid or managed care program needed by the patient, have an available bed and are willing to accept the patient.  08/31/2012  Patient/family informed of [facility]' ownership interest in [facility], as well as of the fact that they are under no obligation to receive care at this facility.  PASARR submitted to EDS on 08/31/2012 PASARR number received from [employer] on 08/31/2012  FL2 transmitted to all facilities in geographic area requested by pt/family on  08/31/2012 FL2 transmitted to all facilities within larger geographic area on   Patient informed that his/her managed care company has contracts with or will negotiate with  certain facilities, including the following:     Patient/family informed of bed offers received:   Patient chooses bed at  Physician recommends and patient chooses bed at    Patient to be transferred to  on   Patient to be transferred to facility by   The following physician request were entered in  Epic:   Additional Comments:  Unice Bailey, LCSW [facility] Clinical Social Worker cell #: [phone]

## 2012-08-31 NOTE — Plan of Care (Signed)
Problem: Increased Energy Expenditure (NI-1.2) Goal: Food and/or nutrient delivery Individualized approach for food/nutrient provision.  Outcome: Completed/Met Date Met:  08/31/12 Clicked wrong diagnosis does not apply.

## 2012-08-31 NOTE — Evaluation (Signed)
Clinical/Bedside Swallow Evaluation Patient Details  Name: Craig White MRN: [id number] Date of Birth: Jun 18, 1919  Today's Date: 08/31/2012 Time: 4098-1191 SLP Time Calculation (min): 38 min  Past Medical History:  Past Medical History  Diagnosis Date  . High cholesterol   . Insomnia   . Osteoporosis   . Osteoarthritis    Past Surgical History:  Past Surgical History  Procedure Date  . Nephrectomy 1959    rt, but doesn't remember why  . Joint replacement     left knee   HPI:  78 yo male adm to [facility] with mental status changes, pt would not get OOB at home and had recent falls and increased weakness.  Pt resides at home, has h/o dementia, insomnia, high cholesterol, OEA, hearing loss.  CT Head 11/4 negative.  CXR 11/3 NAD.  WBC 10.6.  MRI indicated acute CVA.     Assessment / Plan / Recommendation Clinical Impression  Pt presents with functional swallow observed at bedside:  no clinical indicators of aspiration or laryngeal penetration.  Pt was able to self feed (using both right and left hand), per son-in=law, pt is left handed.  Minimal residuals of applesauce noted on chin x2 which son-in-law states has been occuring for some time- cued to clean with tissue.  Rec pt initiate regular/thin diet with intermitent supervision:  Pt eats a rapid rate, which family states is baseline as well.    Family reports pt with slurred speech and difficulty coughing/managing secretions yesterday and state pt has resolved to baseline now.  SLP to follow up x1 to educate re: swallow precautions and dementia/dysphagia.  Thanks.     Aspiration Risk  Mild    Diet Recommendation Regular;Thin liquid   Liquid Administration via: Straw;Cup Medication Administration: Other (Comment) (as tolerated) Supervision: Patient able to self feed Compensations: Slow rate;Small sips/bites Postural Changes and/or Swallow Maneuvers: Seated upright 90 degrees;Upright 30-60 min after meal    Other  Recommendations  Oral Care Recommendations: Oral care before and after PO   Follow Up Recommendations       Frequency and Duration min 1 x/week  1 week   Pertinent Vitals/Pain Afebrile, decreased    SLP Swallow Goals Patient will utilize recommended strategies during swallow to increase swallowing safety with: Minimal assistance   Swallow Study Prior Functional Status   Pt fed self at home.    General Date of Onset: 08/31/12 HPI: 78 yo male adm to [facility] with mental status changes, pt would not get OOB at home and had recent falls and increased weakness.  Pt resides at home, has h/o dementia, insomnia, high cholesterol, OEA, hearing loss.  CT Head 11/4 negative.  CXR 11/3 NAD.  WBC 10.6.  MRI indicated acute CVA.   Type of Study: Bedside swallow evaluation Previous Swallow Assessment: none Diet Prior to this Study: NPO Temperature Spikes Noted: No Respiratory Status: Room air History of Recent Intubation: No Behavior/Cognition: Alert;Cooperative;Pleasant mood;Requires cueing;Hard of hearing;Decreased sustained attention Oral Cavity - Dentition: Adequate natural dentition Self-Feeding Abilities: Needs assist Patient Positioning: Upright in bed (leans to right, son-in-law reports leans rt when sitting  ) Baseline Vocal Quality: Clear Volitional Cough: Other (Comment) (DNT) Volitional Swallow: Unable to elicit (xerostomic)    Oral/Motor/Sensory Function Overall Oral Motor/Sensory Function: Appears within functional limits for tasks assessed   Ice Chips Ice chips: Not tested   Thin Liquid Thin Liquid: Within functional limits Presentation: Cup;Straw;Spoon;Self Fed Other Comments: 6 ounces water, 4 ounces apple juice    Nectar Thick  Nectar Thick Liquid: Not tested   Honey Thick Honey Thick Liquid: Not tested   Puree Puree: Within functional limits Presentation: Self Fed;Spoon Other Comments: 4 ounces icecream, 4 ounces applesauce   Solid   GO    Solid: Within functional limits Presentation:  Self Fed Other Comments: graham cracker:  softened with applesauce       Chales Abrahams 08/31/2012,3:36 PM

## 2012-08-31 NOTE — H&P (Signed)
PCP:  Gaspar Garbe MD  Chief Complaint:  AMS, Falls  HPI: Craig White is a 78 year old longstanding patient of mine.  Usually seen twice yearly for routine care.   Over the past couple of years, he has had progression in dementia which has been discussed at length, however he and family have not accepted medications for this.  He also has considerable hearing deficit.  In the past week, he has had more falls and generalized weakness and has started seeing things that are not there, such as bugs, people, etc.  This was also documented per the ER.  As part of their workup, they did a CT in the ER and the ER MD was told that Craig White was having som speech changes which were recognized by the family.  None are currently present.  I was asked to admit him for ongoing care.  He does not personally have a lot of insight on his condition and is looked after by his son, who was not immediately present.  He also lives with his wife who is of similar age.  Has home health come in a couple times a week.  On Wednesday, his caretaker said that he would not get out of bed,.  Family noted that he was doing ok.  Craig White (son) came home from work last night and found him in the front yard and was on the wrong driveway, laying on the side near the road.  Craig White tried to get him into the car.  He had gone with his walker.  Was sitting up against the car when his son in law got there.   Says that he went to go see about a job and drove (not true since he doesn't drive) and noted seeing his deceased brother and other family members as well as people in the house.  Also noted seeing his grandmother.  Review of Systems:  Negative except as noted in HPI of confusion and delusions Past Medical History: Past Medical History  Diagnosis Date  . High cholesterol   . Insomnia   . Osteoporosis         Dementia, moderate       Hearing deficits        Osteoarthritis Past Surgical History  Procedure Date  . Nephrectomy 1959    rt,  but doesn't remember why  . Joint replacement     left knee    Medications: Prior to Admission medications   Medication Sig Start Date End Date Taking? Authorizing Provider  amitriptyline (ELAVIL) 10 MG tablet Take 10 mg by mouth at bedtime.   Yes Historical Provider, MD  atorvastatin (LIPITOR) 20 MG tablet Take 20 mg by mouth daily.   Yes Historical Provider, MD  Calcium-Vitamin D (CALTRATE 600 PLUS-VIT D PO) Take by mouth.   Yes Historical Provider, MD  doxazosin (CARDURA) 4 MG tablet Take 4 mg by mouth at bedtime.   Yes Historical Provider, MD  ergocalciferol (VITAMIN D2) 50000 UNITS capsule Take 50,000 Units by mouth once a week.   Yes Historical Provider, MD  hydrochlorothiazide (HYDRODIURIL) 12.5 MG tablet Take 12.5 mg by mouth daily.   Yes Historical Provider, MD  zoledronic acid (RECLAST) 5 MG/100ML SOLN Inject 5 mg into the vein once.   Yes Historical Provider, MD    Allergies:  No Known Allergies  Social History:  reports that he has never smoked. He does not have any smokeless tobacco history on file. He reports that he does not  drink alcohol or use illicit drugs.  Family History: Wife Craig White died with arthritis, memory deficits, CVA, looked after by their son Craig White.  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  Physical Exam: Filed Vitals:   08/30/12 2018 08/30/12 2342 08/31/12 0141 08/31/12 0243  BP: 145/87  152/74 146/85  Pulse: 101  100 103  Temp: 97.5 F (36.4 C) 98.1 F (36.7 C)  97.9 F (36.6 C)  TempSrc: Oral   Oral  Resp: 20  16 16   Height:    5\' 4"  (1.626 m)  Weight:    56.473 kg (124 lb 8 oz)  SpO2: 92%  99% 98%   General appearance: alert, appears stated age, delirious and no distress Head: Normocephalic, without obvious abnormality, atraumatic Eyes: conjunctivae/corneas clear. PERRL, EOM's intact.  Nose: Nares normal. Septum midline. Mucosa normal. No drainage or sinus tenderness. Throat: lips, mucosa, and tongue normal; teeth  and gums normal Neck: no adenopathy, no carotid bruit, no JVD and thyroid not enlarged, symmetric, no tenderness/mass/nodules Resp: clear to auscultation bilaterally Cardio: regular rate and rhythm, S1, S2 normal, no murmur, click, rub or gallop GI: soft, non-tender; bowel sounds normal; no masses,  no organomegaly Extremities: extremities normal, atraumatic, no cyanosis or edema Pulses: 2+ and symmetric Lymph nodes: Cervical adenopathy: no cervical lymphadenopathy Neurologic: Alert and oriented X 2 to person and place, not time or circumstance.  Hearing deficits continue to make poor.  Notes his chronic insomnia, which is always his first complaint to me.  No lateralizing signs and strength is at baseline.  Labs on Admission:   [facility] 08/30/12 2132  NA 132*  K 3.5  CL 97  CO2 23  GLUCOSE 105*  BUN 14  CREATININE 0.82  CALCIUM 8.9  MG --  PHOS --    Basename 08/31/12 0015  AST 18  ALT 12  ALKPHOS 83  BILITOT 0.4  PROT 6.2  ALBUMIN 3.1*    Basename 08/30/12 2132  WBC 10.6*  NEUTROABS --  HGB 14.5  HCT 42.5  MCV 92.6  PLT 269    Basename 08/31/12 0015  CKTOTAL --  CKMB --  CKMBINDEX --  TROPONINI <0.30   Lab Results  Component Value Date   INR 0.99 08/31/2012    Radiological Exams on Admission: Ct Head Wo Contrast  08/31/2012  *RADIOLOGY REPORT*  Clinical Data: Altered mental status.  CT HEAD WITHOUT CONTRAST  Technique:  Contiguous axial images were obtained from the base of the skull through the vertex without contrast.  Comparison: None.  Findings: There is no evidence of acute infarction, mass lesion, or intra- or extra-axial hemorrhage on CT.  Prominence of the ventricles and sulci reflects moderate cortical volume loss.  Cerebellar atrophy is noted.  Diffuse periventricular and subcortical white matter change likely reflects small vessel ischemic microangiopathy.  The brainstem and fourth ventricle are within normal limits.  The basal ganglia are  unremarkable in appearance.  The cerebral hemispheres demonstrate grossly normal gray-white differentiation. No mass effect or midline shift is seen.  There is no evidence of fracture; visualized osseous structures are unremarkable in appearance.  The orbits are within normal limits. The paranasal sinuses and mastoid air cells are well-aerated.  No significant soft tissue abnormalities are seen.  IMPRESSION:  1.  No acute intracranial pathology seen on CT. 2.  Moderate cortical volume loss and diffuse small vessel ischemic microangiopathy.   Original Report Authenticated By: Tonia Ghent, M.D.    Dg Chest [facility]  08/30/2012  *RADIOLOGY REPORT*  Clinical Data: Acute mental status changes.  Hyperglycemia. Leukocytosis.  PORTABLE CHEST - 1 VIEW 08/30/2012 2336 hours:  Comparison: None.  Findings: Cardiac silhouette enlarged, even allowing for the AP portable technique.  Thoracic aorta mildly atherosclerotic.  Hilar and mediastinal contours otherwise unremarkable.  Linear scar or atelectasis at the left lung base.  Lungs otherwise clear.  No visible pleural effusions.  IMPRESSION: Mild cardiomegaly.  Linear atelectasis or scar at the left lung base.  No acute cardiopulmonary disease otherwise.   Original Report Authenticated By: Hulan Saas, M.D.    Orders placed in visit on 08/30/12  . EKG 12-LEAD    Assessment/Plan 1) Altered mental status: I believe this is an ongoing progression of his dementia.  We have discussed medications multiple times in the office, including at his last physical several months ago.  Family and patient have always not wanted meds as he had been reorientable by family.  I believe this has continued to have gotten worse, but I am not completely clear why this led to ER evaluation other than for his falls in the past week.  Will complete MRI and if no CVA noted, will continue on his statin and his ASA.  Will also arrange psych eval, which might help with meds.  Would consider  Namenda in this case. TSH, B12, RPR ordered 2) Hyperlipidemia- Was at goal at last office check, will check again 3) OA- Chronic- Avoid narcotics 4) Chronic insomnia- We have limited his sedatives.  He continues to be focused on this issue, despite functioning well during the day. 5) Per initial assessment, he failed his swallowing study this morning.  Will reassess when he is more alert 6) Begeminy/tyrigeminy  Electrolytes normal.  Spoke with family directly about code status and will make him DNR/DNI.  This is consistent with prior outpatient discussions.  Social work to see patient.  Given family discussions, he likely will need to be at a memory care center.  Latwan Luchsinger W 08/31/2012, 6:15 AM

## 2012-08-31 NOTE — ED Notes (Signed)
Patient family will be leaving and will return shortly- [place] ( son in law)  [phone]  Cell number- [phone]

## 2012-08-31 NOTE — Evaluation (Signed)
Physical Therapy Evaluation Patient Details Name: Craig White MRN: [id number] DOB: 1919/02/01 Today's Date: 08/31/2012 Time: 1478-2956 PT Time Calculation (min): 13 min  PT Assessment / Plan / Recommendation Clinical Impression  Pt presents with AMS, weakness and acute posterior temporal lobe infarct.  Tolerated sitting EOB and taking some steps from bed to chair, however requires +2 for safety and due to increased weakness.  Pt unable to fully follow commands due to decreased cognition.  Family unable to provide a complete baseline for pt and state that it changes.  Noted to be very confused during PT eval.  Pt will benefit from skilled PT in acute venue to address deficits.  PT recommends SNF for follow up at D/C to maximize pts safety and decrease burden of care.     PT Assessment  Patient needs continued PT services    Follow Up Recommendations  Supervision/Assistance - 24 hour;Post acute inpatient    Does the patient have the potential to tolerate intense rehabilitation   No, Recommend SNF  Barriers to Discharge Decreased caregiver support      Equipment Recommendations  None recommended by PT    Recommendations for Other Services OT consult   Frequency Min 4X/week    Precautions / Restrictions Precautions Precautions: Fall Precaution Comments: Has R knee pain Restrictions Weight Bearing Restrictions: No   Pertinent Vitals/Pain Pain in R knee with mobility      Mobility  Bed Mobility Bed Mobility: Supine to Sit;Sitting - Scoot to Edge of Bed Supine to Sit: 1: +2 Total assist;HOB elevated;With rails Supine to Sit: Patient Percentage: 40% Sitting - Scoot to Edge of Bed: 1: +2 Total assist Sitting - Scoot to Edge of Bed: Patient Percentage: 50% Details for Bed Mobility Assistance: Assist for LEs off of bed and for trunk to attain sitting position.  Provided cues for hand placement for pt to self assist, however unable to follow commands due to decreased cognition.    Transfers Transfers: Sit to Stand;Stand to Sit;Stand Pivot Transfers Sit to Stand: 1: +2 Total assist;From elevated surface;With upper extremity assist;From bed Sit to Stand: Patient Percentage: 50% Stand to Sit: 1: +2 Total assist;With upper extremity assist;With armrests;To chair/3-in-1 Stand to Sit: Patient Percentage: 50% Stand Pivot Transfers: 1: +2 Total assist Stand Pivot Transfers: Patient Percentage: 50% Details for Transfer Assistance: Assist to rise, steady and maintain upright stance with cues for hand placement and safety due to pt wanting to sit before all the way to chair.  Took some steps from bed to chair, however noted more antalgic gait pattern from R knee pain and pt unable to follow commands for using RW properly.   Ambulation/Gait Assistive device: Rolling walker Stairs: No Wheelchair Mobility Wheelchair Mobility: No    Shoulder Instructions     Exercises     PT Diagnosis: Difficulty walking;Generalized weakness;Acute pain  PT Problem List: Decreased strength;Decreased activity tolerance;Decreased balance;Decreased mobility;Decreased coordination;Decreased cognition;Decreased knowledge of use of DME;Decreased safety awareness;Decreased knowledge of precautions;Pain PT Treatment Interventions: DME instruction;Gait training;Functional mobility training;Therapeutic activities;Therapeutic exercise;Balance training;Patient/family education   PT Goals Acute Rehab PT Goals PT Goal Formulation: With patient/family Time For Goal Achievement: 09/14/12 Potential to Achieve Goals: Fair Pt will go Supine/Side to Sit: with supervision PT Goal: Supine/Side to Sit - Progress: Goal set today Pt will go Sit to Supine/Side: with supervision PT Goal: Sit to Supine/Side - Progress: Goal set today Pt will go Sit to Stand: with supervision PT Goal: Sit to Stand - Progress: Goal set today Pt  will go Stand to Sit: with supervision PT Goal: Stand to Sit - Progress: Goal set today Pt  will Transfer Bed to Chair/Chair to Bed: with min assist PT Transfer Goal: Bed to Chair/Chair to Bed - Progress: Goal set today Pt will Ambulate: 16 - 50 feet;with least restrictive assistive device;with min assist PT Goal: Ambulate - Progress: Progressing toward goal  Visit Information  Last PT Received On: 08/31/12 Assistance Needed: +2    Subjective Data  Subjective: I live with [name].  Patient Stated Goal: n/a   Prior Functioning  Home Living Lives With: Son Available Help at Discharge: Available PRN/intermittently Type of Home: Skilled Nursing Facility Home Adaptive Equipment: Dan Humphreys - standard;Straight cane Prior Function Level of Independence: Needs assistance Needs Assistance: Bathing;Dressing Bath: Minimal Dressing: Minimal Able to Take Stairs?: Yes Driving: No Vocation: Retired Comments: Pts family state that he was able to ambulate with RW around house, however if longer distances, would use w/c.  They also state that he was doing most of his bathing and dressing, but family was helping once or twice a week.  Communication Communication: HOH    Cognition  Overall Cognitive Status: Impaired Area of Impairment: Safety/judgement;Following commands Arousal/Alertness: Awake/alert Orientation Level: Disoriented to;Place;Situation;Time Behavior During Session: [facility] for tasks performed Following Commands: Follows one step commands inconsistently Safety/Judgement: Decreased safety judgement for tasks assessed    Extremity/Trunk Assessment Right Lower Extremity Assessment RLE ROM/Strength/Tone: Deficits RLE ROM/Strength/Tone Deficits: Generalized weakness, grossly 3/5 per functional assessment, however with increased pain due to arthritis.  RLE Sensation: WFL - Light Touch Left Lower Extremity Assessment LLE ROM/Strength/Tone: Deficits LLE ROM/Strength/Tone Deficits: Generalized weakness, grossly 3/5 per functional assessment.  LLE Sensation: WFL - Light Touch Trunk  Assessment Trunk Assessment: Kyphotic   Balance Balance Balance Assessed: Yes Static Sitting Balance Static Sitting - Balance Support: Bilateral upper extremity supported;Feet supported Static Sitting - Level of Assistance: 5: Stand by assistance  End of Session PT - End of Session Equipment Utilized During Treatment: Gait belt Activity Tolerance: Patient limited by pain Patient left: in chair;with call bell/phone within reach;with chair alarm set;with family/visitor present Nurse Communication: Mobility status  GP     Page, Meribeth Mattes 08/31/2012, 5:21 PM

## 2012-08-31 NOTE — Progress Notes (Signed)
INITIAL ADULT NUTRITION ASSESSMENT Date: 08/31/2012   Time: 10:35 AM Reason for Assessment: Consult for kcal count   ASSESSMENT: Male 78 y.o.  Dx: AMS, Falls  Hx:  Past Medical History  Diagnosis Date  . High cholesterol   . Insomnia   . Osteoporosis   . Osteoarthritis     Related Meds:  Scheduled Meds:   . aspirin  325 mg Oral Daily  . atorvastatin  20 mg Oral Daily  . dextrose 5 % and 0.45% NaCl   Intravenous STAT  . doxazosin  4 mg Oral QHS  . influenza  inactive virus vaccine  0.5 mL Intramuscular Tomorrow-1000  . LORazepam  1 mg Intravenous Once  . [COMPLETED] sodium chloride  1,000 mL Intravenous Once  . [DISCONTINUED] sodium chloride  3 mL Intravenous Q12H  . [DISCONTINUED] sodium chloride  3 mL Intravenous Q12H   Continuous Infusions:   . sodium chloride 1,000 mL (08/31/12 0042)  . dextrose 5 % and 0.45% NaCl     PRN Meds:.acetaminophen, acetaminophen, alum & mag hydroxide-simeth, ondansetron (ZOFRAN) IV, ondansetron, senna-docusate, zolpidem, [DISCONTINUED] sodium chloride, [DISCONTINUED] sodium chloride   Ht: 5\' 4"  (162.6 cm)  Wt: 124 lb 8 oz (56.473 kg)  Ideal Wt:  59.2 kg % Ideal Wt: 95% Wt Readings from Last 10 Encounters:  08/31/12 124 lb 8 oz (56.473 kg)    Body mass index is 21.37 kg/(m^2). (WNL)  Food/Nutrition Related Hx: Per patient's son in law patient has lost some weight but unsure of how much he reported he can tell because the pants the patient used to wear are too big now. He reported patient's appetite and intake have been well. Patient eats most of his meals.   Labs:  CMP     Component Value Date/Time   NA 132* 08/30/2012 2132   K 3.5 08/30/2012 2132   CL 97 08/30/2012 2132   CO2 23 08/30/2012 2132   GLUCOSE 105* 08/30/2012 2132   BUN 14 08/30/2012 2132   CREATININE 0.82 08/30/2012 2132   CALCIUM 8.9 08/30/2012 2132   PROT 6.2 08/31/2012 0015   ALBUMIN 3.1* 08/31/2012 0015   AST 18 08/31/2012 0015   ALT 12 08/31/2012 0015   ALKPHOS  83 08/31/2012 0015   BILITOT 0.4 08/31/2012 0015   GFRNONAA 75* 08/30/2012 2132   GFRAA 87* 08/30/2012 2132     Intake/Output Summary (Last 24 hours) at 08/31/12 1036 Last data filed at 08/31/12 0900  Gross per 24 hour  Intake   1500 ml  Output    400 ml  Net   1100 ml     Diet Order: NPO  Supplements/Tube Feeding: none at this time   IVF:    sodium chloride Last Rate: 1,000 mL (08/31/12 0042)  dextrose 5 % and 0.45% NaCl     Estimated Nutritional Needs:   Kcal: 1610-9604 Protein: 65-76 grams  Fluid: 1 ml per kcal intake   NUTRITION DIAGNOSIS: -Predicted suboptimal energy intake (NI-1.6).  Status: Ongoing  RELATED TO: altered mental status  AS EVIDENCE BY: mental status likely to negatively affect PO intake   MONITORING/EVALUATION(Goals): Diet advancements, PO intake, weights, labs 1. Diet advancements as medically able to meet > 90% of estimated energy needs.   EDUCATION NEEDS: -No education needs identified at this time  INTERVENTION: 1. Recommend diet advancement as medically able.  2. RD consulted for calorie count, will begin calorie count upon diet advancement.  Dietitian [phone]  DOCUMENTATION CODES Per approved criteria  -Not Applicable  Iven Finn [facility] 08/31/2012, 10:35 AM

## 2012-08-31 NOTE — Progress Notes (Signed)
*  PRELIMINARY RESULTS* Vascular Ultrasound Carotid Duplex (Doppler) has been completed.  Preliminary findings: Bilateral:  No evidence of hemodynamically significant internal carotid artery stenosis.   Vertebral artery flow is antegrade.      Farrel Demark, RDMS, RVT 08/31/2012, 2:49 PM

## 2012-08-31 NOTE — Progress Notes (Signed)
08-31-12  NSG:  Pt had a run of trigeminy for about 60 seconds then returned to SR c occasional PVC"s .  Pt asymptomatic, Vss, will continue to monitor, strip placed in chart

## 2012-08-31 NOTE — Progress Notes (Signed)
  Echocardiogram 2D Echocardiogram has been performed.  Cathie Beams 08/31/2012, 2:27 PM

## 2012-08-31 NOTE — Progress Notes (Signed)
   CARE MANAGEMENT NOTE 08/31/2012  Patient:  Craig White, Craig White   Account Number:  [id number]  Date Initiated:  08/31/2012  Documentation initiated by:  Jiles Crocker  Subjective/Objective Assessment:   ADMITTED WITH ALTERED MENTAL STATUS     Action/Plan:   PCP: Gaspar Garbe MD    LIVES AT HOME WITH FAMILY; POSSIBLY NEED HHC; AWAITING FOR PT/OT EVALS FOR DISPOSITION   Anticipated DC Date:  09/07/2012   Anticipated DC Plan:  HOME W HOME HEALTH SERVICES      DC Planning Services  CM consult       Status of service:  In process, will continue to follow Medicare Important Message given?  NA - LOS <3 / Initial given by admissions (If response is "NO", the following Medicare IM given date fields will be blank)  Per UR Regulation:  Reviewed for med. necessity/level of care/duration of stay  Comments:  08/31/2012- B Jef Futch RN,BSN, MHA

## 2012-08-31 NOTE — Progress Notes (Signed)
SLP received order for swallow eval.  Pt recently returned from MRI and is currently not alert enough for po or eval.  Note per RN, pt received Ativan.  Asked RN to page SLP if pt awakens adequately for po today.  Otherwise will see pt tomorrow.    Donavan Burnet, MS [facility] SLP [phone]

## 2012-09-01 LAB — CBC
MCH: 31.4 pg (ref 26.0–34.0)
MCV: 95 fL (ref 78.0–100.0)
Platelets: 223 10*3/uL (ref 150–400)
RDW: 13.3 % (ref 11.5–15.5)
WBC: 9.9 10*3/uL (ref 4.0–10.5)

## 2012-09-01 LAB — COMPREHENSIVE METABOLIC PANEL
Albumin: 2.5 g/dL — ABNORMAL LOW (ref 3.5–5.2)
Alkaline Phosphatase: 68 U/L (ref 39–117)
BUN: 8 mg/dL (ref 6–23)
Chloride: 100 mEq/L (ref 96–112)
Potassium: 3.1 mEq/L — ABNORMAL LOW (ref 3.5–5.1)
Total Bilirubin: 0.7 mg/dL (ref 0.3–1.2)

## 2012-09-01 MED ORDER — ENSURE COMPLETE PO LIQD
237.0000 mL | ORAL | Status: DC
  Administered 2012-09-01: 237 mL via ORAL

## 2012-09-01 NOTE — Progress Notes (Signed)
Nutrition Note-Calorie Count  Diet started 11/4.  Regular with Thin Liquids.  No results for calorie count but ate 75% breakfast and 100% lunch per intake records.  Estimated needs:  1475-1775 kcal, 65-75 gm protein  Will Add Ensure Complete once daily. Monitor Calorie count results.  Oran Rein, RD, LDN Clinical Inpatient Dietitian Pager:  [phone] Weekend and after hours pager:  [phone]

## 2012-09-01 NOTE — Progress Notes (Signed)
Physical Therapy Treatment Patient Details Name: Craig White MRN: [id number] DOB: 06-Apr-1919 Today's Date: 09/01/2012 Time: 4098-1191 PT Time Calculation (min): 16 min  PT Assessment / Plan / Recommendation Comments on Treatment Session  Pt continues to be confused about where he is and why he is in hospital, however this seems to be pts baseline, per family.  He did seem to be able to follow safety commands better during todays session.      Follow Up Recommendations  Supervision/Assistance - 24 hour;Post acute inpatient     Does the patient have the potential to tolerate intense rehabilitation  No, Recommend SNF  Barriers to Discharge        Equipment Recommendations  None recommended by PT    Recommendations for Other Services OT consult  Frequency Min 4X/week   Plan Discharge plan remains appropriate    Precautions / Restrictions Precautions Precautions: Fall Precaution Comments: Has R knee pain and some slight buckling Restrictions Weight Bearing Restrictions: No   Pertinent Vitals/Pain Pt c/o pain in R knee when ambulating and demos some slight knee buckling.     Mobility  Bed Mobility Bed Mobility: Not assessed (Pt in recliner when PT arrived. ) Transfers Transfers: Sit to Stand;Stand to Sit;Stand Pivot Transfers Sit to Stand: 1: +2 Total assist;With upper extremity assist;From chair/3-in-1;With armrests Sit to Stand: Patient Percentage: 60% Stand to Sit: 1: +2 Total assist;With upper extremity assist;With armrests;To chair/3-in-1 Stand to Sit: Patient Percentage: 70% Details for Transfer Assistance: Assist to rise and steady with cues for hand placement and safety when sitting/standing.  Did somewhat better with following safety commands today.  Ambulation/Gait Ambulation/Gait Assistance: 1: +2 Total assist Ambulation/Gait: Patient Percentage: 70% Ambulation Distance (Feet): 50 Feet Assistive device: Rolling walker Ambulation/Gait Assistance Details: Cues  provided for sequencing/technique and positioning with RW.  Also requires cues for steering, as pt had a tendency to veer to the left when ambulating. Also noted that pts R knee continues to cause some pain and demos some minor buckling.  Gait Pattern: Step-through pattern;Decreased stride length;Antalgic;Trunk flexed Gait velocity: decreased    Exercises     PT Diagnosis:    PT Problem List:   PT Treatment Interventions:     PT Goals Acute Rehab PT Goals PT Goal Formulation: With patient/family Time For Goal Achievement: 09/14/12 Potential to Achieve Goals: Fair Pt will go Sit to Stand: with supervision PT Goal: Sit to Stand - Progress: Progressing toward goal Pt will go Stand to Sit: with supervision PT Goal: Stand to Sit - Progress: Progressing toward goal Pt will Transfer Bed to Chair/Chair to Bed: with min assist PT Transfer Goal: Bed to Chair/Chair to Bed - Progress: Progressing toward goal Pt will Ambulate: 16 - 50 feet;with least restrictive assistive device;with min assist PT Goal: Ambulate - Progress: Progressing toward goal  Visit Information  Last PT Received On: 09/01/12 Assistance Needed: +2    Subjective Data  Subjective: Is Craig White here? Patient Stated Goal: n/a   Cognition  Overall Cognitive Status: Impaired Area of Impairment: Following commands;Safety/judgement;Awareness of errors;Awareness of deficits;Problem solving Arousal/Alertness: Awake/alert Orientation Level: Disoriented to;Place;Situation;Time Behavior During Session: [facility] for tasks performed Following Commands: Follows one step commands inconsistently Safety/Judgement: Decreased safety judgement for tasks assessed Awareness of Errors: Assistance required to identify errors made;Assistance required to correct errors made    Balance     End of Session PT - End of Session Equipment Utilized During Treatment: Gait belt Activity Tolerance: Patient limited by pain Patient left: in  chair;with call  bell/phone within reach;with chair alarm set Nurse Communication: Mobility status   GP     Page, Meribeth Mattes 09/01/2012, 10:22 AM

## 2012-09-01 NOTE — Progress Notes (Signed)
Subjective: Did pretty well yesterday.  Failed his initial swallowing eval and had a formal bedside eval that he passed later yesterday, so a diet was started.  He is eating eggs this morning and no coughing noted.  Still not oriented to time/circumstance, which has been his baseline.  Family has been in contact with social work and after discussion yesterday, are in agreement with placement.  His overall mental status, although confused, is improved from yesterday AM when he was slow to answer and not aware, presumably from resolving stroke and metabolic effects.  Objective: Vital signs in last 24 hours: Temp:  [97.7 F (36.5 C)-98 F (36.7 C)] 98 F (36.7 C) (11/05 0450) Pulse Rate:  [85-90] 90  (11/05 0450) Resp:  [16-18] 16  (11/05 0450) BP: (93-130)/(50-79) 113/62 mmHg (11/05 0450) SpO2:  [94 %-98 %] 94 % (11/05 0450) Weight change:  Last BM Date: 08/29/12  Intake/Output from previous day: 11/04 0701 - 11/05 0700 In: 2040 [P.O.:240; I.V.:1800] Out: 1050 [Urine:1050] Intake/Output this shift:    General appearance: alert, cooperative and appears stated age Neck: no adenopathy, no carotid bruit, no JVD, supple, symmetrical, trachea midline and thyroid not enlarged, symmetric, no tenderness/mass/nodules Resp: clear to auscultation bilaterally Cardio: regular rate and rhythm, S1, S2 normal, no murmur, click, rub or gallop Neuro:  Oriented to person, not place, tim, circumstance.  Could not name me (his MD for 11 years) but that has been the norm for the past couple.  Lab Results:  [facility] 09/01/12 0457 08/30/12 2132  WBC 9.9 10.6*  HGB 12.5* 14.5  HCT 37.8* 42.5  PLT 223 269   BMET  Basename 09/01/12 0457 08/30/12 2132  NA 132* 132*  K 3.1* 3.5  CL 100 97  CO2 24 23  GLUCOSE 99 105*  BUN 8 14  CREATININE 0.69 0.82  CALCIUM 7.9* 8.9    Studies/Results: Ct Head Wo Contrast  08/31/2012  *RADIOLOGY REPORT*  Clinical Data: Altered mental status.  CT HEAD WITHOUT  CONTRAST  Technique:  Contiguous axial images were obtained from the base of the skull through the vertex without contrast.  Comparison: None.  Findings: There is no evidence of acute infarction, mass lesion, or intra- or extra-axial hemorrhage on CT.  Prominence of the ventricles and sulci reflects moderate cortical volume loss.  Cerebellar atrophy is noted.  Diffuse periventricular and subcortical white matter change likely reflects small vessel ischemic microangiopathy.  The brainstem and fourth ventricle are within normal limits.  The basal ganglia are unremarkable in appearance.  The cerebral hemispheres demonstrate grossly normal gray-white differentiation. No mass effect or midline shift is seen.  There is no evidence of fracture; visualized osseous structures are unremarkable in appearance.  The orbits are within normal limits. The paranasal sinuses and mastoid air cells are well-aerated.  No significant soft tissue abnormalities are seen.  IMPRESSION:  1.  No acute intracranial pathology seen on CT. 2.  Moderate cortical volume loss and diffuse small vessel ischemic microangiopathy.   Original Report Authenticated By: Tonia Ghent, M.D.    Mr Brain Wo Contrast  08/31/2012  *RADIOLOGY REPORT*  Clinical Data: Altered mental status.  Weakness.  Hyperglycemia.  MRI HEAD WITHOUT CONTRAST  Technique:  Multiplanar, multiecho pulse sequences of the brain and surrounding structures were obtained according to standard protocol without intravenous contrast.  Comparison: Head CT same day  Findings: There is a punctate acute infarction within the right posteromedial temporal lobe.  No other acute infarction.  There are extensive chronic  small vessel changes affecting the pons.  There are old small vessel infarctions within the cerebellum.  The cerebral hemispheres show generalized atrophy with patchy and confluent chronic small vessel disease throughout the white matter. No large vessel territory infarction.  No  mass lesion, hemorrhage, hydrocephalus or extra-axial collection.  No pituitary mass.  No inflammatory sinus disease.  No skull or skull base lesion.  IMPRESSION: Punctate acute infarction within the posterior temporal lobe on the right.  Advanced and extensive chronic small vessel disease elsewhere throughout the brain.   Original Report Authenticated By: Paulina Fusi, M.D.    Dg Chest Port 1 View  08/30/2012  *RADIOLOGY REPORT*  Clinical Data: Acute mental status changes.  Hyperglycemia. Leukocytosis.  PORTABLE CHEST - 1 VIEW 08/30/2012 2336 hours:  Comparison: None.  Findings: Cardiac silhouette enlarged, even allowing for the AP portable technique.  Thoracic aorta mildly atherosclerotic.  Hilar and mediastinal contours otherwise unremarkable.  Linear scar or atelectasis at the left lung base.  Lungs otherwise clear.  No visible pleural effusions.  IMPRESSION: Mild cardiomegaly.  Linear atelectasis or scar at the left lung base.  No acute cardiopulmonary disease otherwise.   Original Report Authenticated By: Hulan Saas, M.D.     Medications:  I have reviewed the patient's current medications. Scheduled:   . aspirin EC  81 mg Oral Daily  . atorvastatin  20 mg Oral Daily  . [COMPLETED] dextrose 5 % and 0.45% NaCl   Intravenous STAT  . doxazosin  4 mg Oral QHS  . influenza  inactive virus vaccine  0.5 mL Intramuscular Tomorrow-1000  . [COMPLETED] LORazepam  1 mg Intravenous Once  . memantine  5 mg Oral Daily  . [DISCONTINUED] aspirin  325 mg Oral Daily   Continuous:   . sodium chloride 125 mL/hr at 09/01/12 0408  . dextrose 5 % and 0.45% NaCl 75 mL/hr at 08/31/12 1847   ZOX:[id number], acetaminophen, alum & mag hydroxide-simeth, ondansetron (ZOFRAN) IV, ondansetron, senna-docusate, zolpidem  Assessment/Plan: 1) Altered mental status: I believe this is an ongoing progression of his dementia as well as short term stroke effects, and is agreed with by Neuro.  Nothing to add except  trial of Namenda, which may help his behaviors some.  Expect sundowning to be more of a problem in winter months and time change as well.  Did ok yesterday with first dose.  Plan to add 5mg  weekly to goal of 10mg  bid.  Continue ASA/Lipitor at same doses 2) Hyperlipidemia- Was at goal at last office check.  LDL 100 here.  No changes per Neuro 3) OA- Chronic- Avoid narcotics  4) Chronic insomnia- We have limited his sedatives. He continues to be focused on this issue, despite functioning well during the day.  5) Per initial assessment, he failed his swallowing study but this has resolved over the course of yesterday and is eating ok. 6) Begeminy/tyrigeminy Electrolytes normal. Will take off tele 7) Protein Calorie Malnutrition- Low initial albumin.  Will calorie count and liberalize meals.  Completed his stroke workup with above result.  No change in meds overall.  Will need SNF/memory care placement, which is currently being researched.   To facility tomorrow if bed available  DNR/DNI.  Portable forms signed as well.   LOS: 2 days   Jillyn Stacey W 09/01/2012, 7:44 AM

## 2012-09-02 ENCOUNTER — Inpatient Hospital Stay (HOSPITAL_COMMUNITY): Payer: Medicare Other

## 2012-09-02 MED ORDER — ENSURE COMPLETE PO LIQD: 237.0000 mL | ORAL | Status: DC

## 2012-09-02 MED ORDER — ACETAMINOPHEN 325 MG PO TABS: 650.0000 mg | ORAL_TABLET | Freq: Four times a day (QID) | ORAL | Status: DC | PRN

## 2012-09-02 MED ORDER — ASPIRIN 81 MG PO TBEC: 81.0000 mg | DELAYED_RELEASE_TABLET | Freq: Every day | ORAL | Status: AC

## 2012-09-02 MED ORDER — MEMANTINE HCL 5 MG PO TABS: 5.0000 mg | ORAL_TABLET | Freq: Every day | ORAL | Status: DC

## 2012-09-02 MED ORDER — SENNOSIDES-DOCUSATE SODIUM 8.6-50 MG PO TABS: 1.0000 | ORAL_TABLET | Freq: Every evening | ORAL | Status: DC | PRN

## 2012-09-02 MED ORDER — ZOLPIDEM TARTRATE 5 MG PO TABS: 5.0000 mg | ORAL_TABLET | Freq: Every evening | ORAL | Status: DC | PRN

## 2012-09-02 NOTE — Progress Notes (Signed)
Subjective: Doing well.  Eating well.  Still having pain in his R knee.  His son in law, Christen Bame, was here this morning.  They are trying to get him into [facility], but have met with some resistance because Craig White was found outside.  He does not wander when people are around but was clearly left alone and looking for someone when he was found outside.  Objective: Vital signs in last 24 hours: Temp:  [97.7 F (36.5 C)-99.2 F (37.3 C)] 98 F (36.7 C) (11/06 0450) Pulse Rate:  [87-96] 96  (11/06 0450) Resp:  [18] 18  (11/06 0450) BP: (105-124)/(58-79) 105/79 mmHg (11/06 0450) SpO2:  [98 %] 98 % (11/06 0450) Weight change:  Last BM Date: 09/01/12  Intake/Output from previous day: 11/05 0701 - 11/06 0700 In: 1983.8 [P.O.:240; I.V.:1743.8] Out: 500 [Urine:500] Intake/Output this shift:  General appearance: alert, cooperative and appears stated age  Neck: no adenopathy, no carotid bruit, no JVD, supple, symmetrical, trachea midline and thyroid not enlarged, symmetric, no tenderness/mass/nodules  Resp: clear to auscultation bilaterally  Cardio: regular rate and rhythm, S1, S2 normal, no murmur, click, rub or gallop  Ortho:  Normal ROM R knee, difficulty bearing weight Neuro: Oriented to person, not place, tim, circumstance. Could not name me (his MD for 11 years) but that has been the norm for the past couple.   Lab Results:  [facility] 09/01/12 0457 08/30/12 2132  WBC 9.9 10.6*  HGB 12.5* 14.5  HCT 37.8* 42.5  PLT 223 269   BMET  Basename 09/01/12 0457 08/30/12 2132  NA 132* 132*  K 3.1* 3.5  CL 100 97  CO2 24 23  GLUCOSE 99 105*  BUN 8 14  CREATININE 0.69 0.82  CALCIUM 7.9* 8.9    Studies/Results: Mr Brain Wo Contrast  08/31/2012  *RADIOLOGY REPORT*  Clinical Data: Altered mental status.  Weakness.  Hyperglycemia.  MRI HEAD WITHOUT CONTRAST  Technique:  Multiplanar, multiecho pulse sequences of the brain and surrounding structures were obtained according to standard protocol  without intravenous contrast.  Comparison: Head CT same day  Findings: There is a punctate acute infarction within the right posteromedial temporal lobe.  No other acute infarction.  There are extensive chronic small vessel changes affecting the pons.  There are old small vessel infarctions within the cerebellum.  The cerebral hemispheres show generalized atrophy with patchy and confluent chronic small vessel disease throughout the white matter. No large vessel territory infarction.  No mass lesion, hemorrhage, hydrocephalus or extra-axial collection.  No pituitary mass.  No inflammatory sinus disease.  No skull or skull base lesion.  IMPRESSION: Punctate acute infarction within the posterior temporal lobe on the right.  Advanced and extensive chronic small vessel disease elsewhere throughout the brain.   Original Report Authenticated By: Paulina Fusi, M.D.     Medications:  I have reviewed the patient's current medications.  Scheduled:  .  aspirin EC  81 mg  Oral  Daily   .  atorvastatin  20 mg  Oral  Daily   .  [COMPLETED] dextrose 5 % and 0.45% NaCl   Intravenous  STAT   .  doxazosin  4 mg  Oral  QHS   .  influenza inactive virus vaccine  0.5 mL  Intramuscular  Tomorrow-1000   .  [COMPLETED] LORazepam  1 mg  Intravenous  Once   .  memantine  5 mg  Oral  Daily   .  [DISCONTINUED] aspirin  325 mg  Oral  Daily  Continuous:  .  sodium chloride  125 mL/hr at 09/01/12 0408   .  dextrose 5 % and 0.45% NaCl  75 mL/hr at 08/31/12 1847    WUJ:[id number], acetaminophen, alum & mag hydroxide-simeth, ondansetron (ZOFRAN) IV, ondansetron, senna-docusate, zolpidem  Assessment/Plan: 1) Altered mental status: I believe this is an ongoing progression of his dementia as well as short term stroke effects, and is agreed with by Neuro. Nothing to add except trial of Namenda, which may help his behaviors some. Expect sundowning to be more of a problem in winter months and time change as well. Did ok yesterday  with first dose. Plan to add 5mg  weekly to goal of 10mg  bid. Continue ASA/Lipitor at same doses   He does not have a wandering nature or flight risk and is very amenable to redirection.  Hopefully [facility] will se this as he has family and friends at that facility and it is convenient for family visitation. 2) Hyperlipidemia- Was at goal at last office check. LDL 100 here. No changes per Neuro  3) OA- Chronic- Avoid narcotics  4) Chronic insomnia- We have limited his sedatives. He continues to be focused on this issue, despite functioning well during the day.  5) Per initial assessment, he failed his swallowing study but this has resolved over the course of the day Monday and is eating ok.  6) Begeminy/tyrigeminy Electrolytes normal. Will take off tele  7) Protein Calorie Malnutrition- Low initial albumin. Will calorie count and liberalize meals. Getting extra Ensure as well per Nutrition  Awaiting placement.  If able to go today, will complete discharge orders.  Medically stable, just awaiting placement.  DNR/DNI. Portable forms signed as well.   LOS: 3 days   Pavle Wiler W 09/02/2012, 8:05 AM

## 2012-09-02 NOTE — Progress Notes (Signed)
Patient is set to discharge to [facility] today. Patient's daughter went by the facility earlier to complete admission paperwork and will be by this afternoon to transport patient to [facility]. Chart copy packet in [place].   Clinical Social Work Department CLINICAL SOCIAL WORK PLACEMENT NOTE 09/02/2012  Patient:  Craig White, Craig White  Account Number:  [id number] Admit date:  08/30/2012  Clinical Social Worker:  Orpah Greek  Date/time:  08/31/2012 03:17 PM  Clinical Social Work is seeking post-discharge placement for this patient at the following level of care:   SKILLED NURSING   (*CSW will update this form in Epic as items are completed)   08/31/2012  Patient/family provided with [facility] Department of Clinical Social Work's list of facilities offering this level of care within the geographic area requested by the patient (or if unable, by the patient's family).  08/31/2012  Patient/family informed of their freedom to choose among providers that offer the needed level of care, that participate in Medicare, Medicaid or managed care program needed by the patient, have an available bed and are willing to accept the patient.  08/31/2012  Patient/family informed of [facility]' ownership interest in [facility], as well as of the fact that they are under no obligation to receive care at this facility.  PASARR submitted to EDS on 08/31/2012 PASARR number received from [employer] on 08/31/2012  FL2 transmitted to all facilities in geographic area requested by pt/family on  08/31/2012 FL2 transmitted to all facilities within larger geographic area on   Patient informed that his/her managed care company has contracts with or will negotiate with  certain facilities, including the following:     Patient/family informed of bed offers received:  09/02/2012 Patient chooses bed at [facility] [place] Physician recommends and patient chooses bed at     [facility] [place] on  09/02/2012 Patient to be transferred to facility by daughter's car  The following physician request were entered in Epic:   Additional Comments:  Unice Bailey, LCSW [facility] Clinical Social Worker cell #: [phone]

## 2012-09-02 NOTE — Discharge Summary (Signed)
DISCHARGE SUMMARY  [name]  MR#: [id number]  DOB:11-08-1918  Date of Admission: 08/30/2012 Date of Discharge: 09/02/2012  Attending Physician:Loys Hoselton W  Patient's PCP: Gaspar Garbe MD Consults: [facility]  Discharge Diagnoses: R punctate thalamic CVA Moderate Senile Dementia with Alzheimer's Type Acute on chronic confusion Falls risk Osteoarthritis- Severe Hyperlipidemia HTN BPH Protein Calorie Malnutrition Osteopenia Vitamin D deficiency  Discharge Medications:   Medication List     As of 09/02/2012 12:25 PM    STOP taking these medications         amitriptyline 10 MG tablet   Commonly known as: ELAVIL      zoledronic acid 5 MG/100ML Soln   Commonly known as: RECLAST      TAKE these medications         acetaminophen 325 MG tablet   Commonly known as: TYLENOL   Take 2 tablets (650 mg total) by mouth every 6 (six) hours as needed (or Fever >/= 101).      aspirin 81 MG EC tablet   Take 1 tablet (81 mg total) by mouth daily.      atorvastatin 20 MG tablet   Commonly known as: LIPITOR   Take 20 mg by mouth daily.      CALTRATE 600 PLUS-VIT D PO   Take by mouth.      doxazosin 4 MG tablet   Commonly known as: CARDURA   Take 4 mg by mouth at bedtime.      ergocalciferol 50000 UNITS capsule   Commonly known as: VITAMIN D2   Take 50,000 Units by mouth once a week.      feeding supplement Liqd   Take 237 mLs by mouth daily.      hydrochlorothiazide 12.5 MG tablet   Commonly known as: HYDRODIURIL   Take 12.5 mg by mouth daily.      memantine 5 MG tablet   Commonly known as: NAMENDA   Take 1 tablet (5 mg total) by mouth daily.      senna-docusate 8.6-50 MG per tablet   Commonly known as: Senokot-S   Take 1 tablet by mouth at bedtime as needed.      zolpidem 5 MG tablet   Commonly known as: AMBIEN   Take 1 tablet (5 mg total) by mouth at bedtime as needed for sleep (insomnia).        Hospital Procedures: Ct Head  Wo Contrast  08/31/2012  *RADIOLOGY REPORT*  Clinical Data: Altered mental status.  CT HEAD WITHOUT CONTRAST  Technique:  Contiguous axial images were obtained from the base of the skull through the vertex without contrast.  Comparison: None.  Findings: There is no evidence of acute infarction, mass lesion, or intra- or extra-axial hemorrhage on CT.  Prominence of the ventricles and sulci reflects moderate cortical volume loss.  Cerebellar atrophy is noted.  Diffuse periventricular and subcortical white matter change likely reflects small vessel ischemic microangiopathy.  The brainstem and fourth ventricle are within normal limits.  The basal ganglia are unremarkable in appearance.  The cerebral hemispheres demonstrate grossly normal gray-white differentiation. No mass effect or midline shift is seen.  There is no evidence of fracture; visualized osseous structures are unremarkable in appearance.  The orbits are within normal limits. The paranasal sinuses and mastoid air cells are well-aerated.  No significant soft tissue abnormalities are seen.  IMPRESSION:  1.  No acute intracranial pathology seen on CT. 2.  Moderate cortical volume loss and diffuse small vessel ischemic microangiopathy.  Original Report Authenticated By: Tonia Ghent, M.D.    Mr Brain Wo Contrast  08/31/2012  *RADIOLOGY REPORT*  Clinical Data: Altered mental status.  Weakness.  Hyperglycemia.  MRI HEAD WITHOUT CONTRAST  Technique:  Multiplanar, multiecho pulse sequences of the brain and surrounding structures were obtained according to standard protocol without intravenous contrast.  Comparison: Head CT same day  Findings: There is a punctate acute infarction within the right posteromedial temporal lobe.  No other acute infarction.  There are extensive chronic small vessel changes affecting the pons.  There are old small vessel infarctions within the cerebellum.  The cerebral hemispheres show generalized atrophy with patchy and confluent  chronic small vessel disease throughout the white matter. No large vessel territory infarction.  No mass lesion, hemorrhage, hydrocephalus or extra-axial collection.  No pituitary mass.  No inflammatory sinus disease.  No skull or skull base lesion.  IMPRESSION: Punctate acute infarction within the posterior temporal lobe on the right.  Advanced and extensive chronic small vessel disease elsewhere throughout the brain.   Original Report Authenticated By: Paulina Fusi, M.D.    Dg Chest Port 1 View  08/30/2012  *RADIOLOGY REPORT*  Clinical Data: Acute mental status changes.  Hyperglycemia. Leukocytosis.  PORTABLE CHEST - 1 VIEW 08/30/2012 2336 hours:  Comparison: None.  Findings: Cardiac silhouette enlarged, even allowing for the AP portable technique.  Thoracic aorta mildly atherosclerotic.  Hilar and mediastinal contours otherwise unremarkable.  Linear scar or atelectasis at the left lung base.  Lungs otherwise clear.  No visible pleural effusions.  IMPRESSION: Mild cardiomegaly.  Linear atelectasis or scar at the left lung base.  No acute cardiopulmonary disease otherwise.   Original Report Authenticated By: Hulan Saas, M.D.    Dg Knee Right Port  09/02/2012  *RADIOLOGY REPORT*  Clinical Data: Osteoarthritis.  Pain.  Fall  PORTABLE RIGHT KNEE - 1-2 VIEW  Comparison: None  Findings: There is a small joint effusion.  Severe tricompartment osteoarthritis is noted.  Chondrocalcinosis is present.  No fracture or subluxation.  IMPRESSION:  1.  Severe tricompartment osteoarthritis. 2.  Chondrocalcinosis.   Original Report Authenticated By: Signa Kell, M.D.     History of Present Illness: Patient is a 78 year old who was found outside of his son's house on the ground with confusion and inability to stand.  Hospital Course: Mr Craig White was admitted by me following ER eval.  An MRI revealed a R sided thalamic CVA.  He failed his initial swallowing study and was confused.  This improved late in the course of  day #1 and he was able to pass his swallowing evaluation.  He also had PT and nutrition evaluations as well.  Had difficulty with ambulation due to his arthritis of his knees, which has been a longstanding problem.  Imaging revealed this as well with no note of acute fracture.  He was seen by Neuro who essentially had nothing to add to his therapy other than continuation of his statin and aspirin as well as discontinuation of sedatives including Elavil.  I started him on Namenda 5mg  in the evenings.  He and his family, despite knowledge of his dementia, were hesitant to undergo medications in the past, but more accepting now.  Discussion with family revealed that he clearly could not be cared for 24 hours full time at home and the decision for placement was pursued, settling on [facility], which is where other friends and family are present.  Discharge is planned for later on his 3rd day of  hospitalization.  Day of Discharge Exam BP 105/79  Pulse 96  Temp 98 F (36.7 C) (Oral)  Resp 18  Ht 5\' 4"  (1.626 m)  Wt 56.473 kg (124 lb 8 oz)  BMI 21.37 kg/m2  SpO2 98%  Physical Exam: General appearance: alert, cooperative and appears stated age Eyes: no scleral icterus Throat: oropharynx moist without erythema Resp: clear to auscultation bilaterally Cardio: regular rate and rhythm, S1, S2 normal, no murmur, click, rub or gallop GI: soft, non-tender; bowel sounds normal; no masses,  no organomegaly Extremities: no clubbing, cyanosis or edema Neuro- Baseline dementia, oriented to person only, no other deficits.  Discharge Labs:  [facility] 09/01/12 0457 08/30/12 2132  NA 132* 132*  K 3.1* 3.5  CL 100 97  CO2 24 23  GLUCOSE 99 105*  BUN 8 14  CREATININE 0.69 0.82  CALCIUM 7.9* 8.9  MG -- --  PHOS -- --    Basename 09/01/12 0457 08/31/12 0015  AST 19 18  ALT 10 12  ALKPHOS 68 83  BILITOT 0.7 0.4  PROT 5.2* 6.2  ALBUMIN 2.5* 3.1*    Basename 09/01/12 0457 08/30/12 2132  WBC 9.9 10.6*    NEUTROABS -- --  HGB 12.5* 14.5  HCT 37.8* 42.5  MCV 95.0 92.6  PLT 223 269   Lab Results  Component Value Date   INR 0.99 08/31/2012    Basename 08/31/12 0015  CKTOTAL --  CKMB --  CKMBINDEX --  TROPONINI <0.30    Basename 08/31/12 0643  TSH 4.109  T4TOTAL --  T3FREE --  THYROIDAB --    Basename 08/31/12 0643  VITAMINB12 417  FOLATE --  FERRITIN --  TIBC --  IRON --  RETICCTPCT --    Discharge instructions: Titration of Memantine noted as weekly.   Disposition: [facility] Follow-up Appts: Follow-up with Dr. Wylene Simmer at [facility] if released from [facility], otherwise will be managed by on site MD. Condition on Discharge: Good  Tests Needing Follow-up: None  Signed: Burman Bruington W 09/02/2012, 12:25 PM

## 2012-09-02 NOTE — Progress Notes (Signed)
Speech Language Pathology Dysphagia Treatment Patient Details Name: Craig White MRN: [id number] DOB: 07-06-1919 Today's Date: 09/02/2012 Time: 7829-5621 SLP Time Calculation (min): 18 min  Assessment / Plan / Recommendation Clinical Impression  Pt seen to assess tolerance of diet, son Tinnie Gens present and reports pt with good tolerance of po - no coughing with intake last night.  Son acknowledges pt with mild cough this am while sleeping.  Pt states he swallow ability is at baseline with direct question and he denies symptoms of reflux.  Pt self fed icecream - four ounces and water with efficiency.  Cough x1 noted with water only.  Intake documented as 75%.  Rec pt continue current diet with strict aspiration precautions.    SLP educated pt and son to compensatory strategies with probable progression of dysphagia with dementia.  Advised son to signs of aspiration to monitor to which he verbalized understanding.  SLP to sign off currently as all education is complete.        Diet Recommendation  Continue with Current Diet: Regular;Thin liquid    SLP Plan All goals met   Pertinent Vitals/Pain Afebrile today, lungs clear, good intake   Swallowing Goals  SLP Swallowing Goals Patient will utilize recommended strategies during swallow to increase swallowing safety with:  (pt takes small bites naturally even with cog deficit )  General Temperature Spikes Noted: No Respiratory Status: Room air Behavior/Cognition: Alert;Cooperative;Pleasant mood Oral Cavity - Dentition: Adequate natural dentition  Oral Cavity - Oral Hygiene     Dysphagia Treatment Treatment focused on: Skilled observation of diet tolerance;Patient/family/caregiver education Family/Caregiver Educated: son Tinnie Gens Treatment Methods/Modalities: Skilled observation Patient observed directly with PO's: Yes Type of PO's observed:  (icecream and water) Feeding: Able to feed self Liquids provided via: Cup Pharyngeal Phase Signs  & Symptoms: Immediate cough (cough x1 after water swallow - )   GO     Donavan Burnet, MS [facility] SLP [phone]

## 2012-09-02 NOTE — Progress Notes (Signed)
CSW spoke with [name] @ [facility] [place], confirmed that they would indeed be offering a bed. Patient's daughter, Craig White (cell#: [phone]) is aware. CSW made Dr. Deneen Harts RN, Carollee Herter (ph#: [phone]) aware to let Dr. Wylene Simmer know - just waiting for the discharge summary. Anticipating discharge this afternoon. RN, Amil Amen ([id number]) aware.   Clinical Social Work Department CLINICAL SOCIAL WORK PLACEMENT NOTE 09/02/2012  Patient:  Craig White, Craig White  Account Number:  [id number] Admit date:  08/30/2012  Clinical Social Worker:  Orpah Greek  Date/time:  08/31/2012 03:17 PM  Clinical Social Work is seeking post-discharge placement for this patient at the following level of care:   SKILLED NURSING   (*CSW will update this form in Epic as items are completed)   08/31/2012  Patient/family provided with [facility] Department of Clinical Social Work's list of facilities offering this level of care within the geographic area requested by the patient (or if unable, by the patient's family).  08/31/2012  Patient/family informed of their freedom to choose among providers that offer the needed level of care, that participate in Medicare, Medicaid or managed care program needed by the patient, have an available bed and are willing to accept the patient.  08/31/2012  Patient/family informed of [facility]' ownership interest in [facility], as well as of the fact that they are under no obligation to receive care at this facility.  PASARR submitted to EDS on 08/31/2012 PASARR number received from EDS on 08/31/2012  FL2 transmitted to all facilities in geographic area requested by pt/family on  08/31/2012 FL2 transmitted to all facilities within larger geographic area on   Patient informed that his/her managed care company has contracts with or will negotiate with  certain facilities, including the following:     Patient/family informed of bed offers received:  09/02/2012 Patient chooses  bed at [facility], [place] Physician recommends and patient chooses bed at    Patient to be transferred to [facility], [place] on   Patient to be transferred to facility by   The following physician request were entered in Epic:   Additional Comments:    Unice Bailey, LCSW [facility] Clinical Social Worker cell #: [phone]

## 2013-08-08 ENCOUNTER — Emergency Department (HOSPITAL_COMMUNITY): Payer: Medicare Other

## 2013-08-08 ENCOUNTER — Encounter (HOSPITAL_COMMUNITY): Payer: Self-pay | Admitting: Emergency Medicine

## 2013-08-08 ENCOUNTER — Emergency Department (HOSPITAL_COMMUNITY)
Admission: EM | Admit: 2013-08-08 | Discharge: 2013-08-08 | Disposition: A | Discharge status: Home / Self Care | Payer: Medicare Other | Attending: Emergency Medicine | Admitting: Emergency Medicine

## 2013-08-08 DIAGNOSIS — E7800 Pure hypercholesterolemia, unspecified: Secondary | ICD-10-CM | POA: Insufficient documentation

## 2013-08-08 DIAGNOSIS — W1809XA Striking against other object with subsequent fall, initial encounter: Secondary | ICD-10-CM | POA: Insufficient documentation

## 2013-08-08 DIAGNOSIS — S0181XA Laceration without foreign body of other part of head, initial encounter: Secondary | ICD-10-CM

## 2013-08-08 DIAGNOSIS — W19XXXA Unspecified fall, initial encounter: Secondary | ICD-10-CM

## 2013-08-08 DIAGNOSIS — Z7982 Long term (current) use of aspirin: Secondary | ICD-10-CM | POA: Insufficient documentation

## 2013-08-08 DIAGNOSIS — Z79899 Other long term (current) drug therapy: Secondary | ICD-10-CM | POA: Insufficient documentation

## 2013-08-08 DIAGNOSIS — Y9210 Unspecified residential institution as the place of occurrence of the external cause: Secondary | ICD-10-CM | POA: Insufficient documentation

## 2013-08-08 DIAGNOSIS — M1990 Unspecified osteoarthritis, unspecified site: Secondary | ICD-10-CM | POA: Insufficient documentation

## 2013-08-08 DIAGNOSIS — S0180XA Unspecified open wound of other part of head, initial encounter: Secondary | ICD-10-CM | POA: Insufficient documentation

## 2013-08-08 DIAGNOSIS — Z23 Encounter for immunization: Secondary | ICD-10-CM | POA: Insufficient documentation

## 2013-08-08 DIAGNOSIS — Y9389 Activity, other specified: Secondary | ICD-10-CM | POA: Insufficient documentation

## 2013-08-08 MED ORDER — TETANUS-DIPHTH-ACELL PERTUSSIS 5-2.5-18.5 LF-MCG/0.5 IM SUSP
0.5000 mL | Freq: Once | INTRAMUSCULAR | Status: AC
  Administered 2013-08-08: 0.5 mL via INTRAMUSCULAR
  Filled 2013-08-08: qty 0.5

## 2013-08-08 MED ORDER — ACETAMINOPHEN 325 MG PO TABS
650.0000 mg | ORAL_TABLET | Freq: Once | ORAL | Status: AC
  Administered 2013-08-08: 650 mg via ORAL
  Filled 2013-08-08: qty 2

## 2013-08-08 NOTE — ED Notes (Signed)
Bed: WU98 Expected date:  Expected time:  Means of arrival:  Comments: ems- 79 yo M, fall, no LOC

## 2013-08-08 NOTE — ED Notes (Signed)
Patient transported to X-ray 

## 2013-08-08 NOTE — ED Provider Notes (Addendum)
CSN: [id number]     Arrival date & time 08/08/13  1031 History   First MD Initiated Contact with Patient 08/08/13 1038     Chief Complaint  Patient presents with  . Fall   (Consider location/radiation/quality/duration/timing/severity/associated sxs/prior Treatment) Patient is a 79 y.o. male presenting with fall. The history is provided by the patient and a relative.  Fall This is a new problem. The current episode started less than 1 hour ago. The problem occurs constantly. The problem has been resolved. Associated symptoms comments: Got out of bed without help today at fell face first hitting his face on a dresser.  No c/o except for some mild hip pain.  Got a laceration to his face but no LOC and no neck pain.  Pt was able to walk after the incident. Nothing aggravates the symptoms. Nothing relieves the symptoms. He has tried nothing for the symptoms. The treatment provided no relief.    Past Medical History  Diagnosis Date  . High cholesterol   . Insomnia   . Osteoporosis   . Osteoarthritis    Past Surgical History  Procedure Laterality Date  . Nephrectomy  1959    rt, but doesn't remember why  . Joint replacement      left knee   No family history on file. History  Substance Use Topics  . Smoking status: Never Smoker   . Smokeless tobacco: Never Used  . Alcohol Use: No    Review of Systems  All other systems reviewed and are negative.    Allergies  Review of patient's allergies indicates no known allergies.  Home Medications   Current Outpatient Rx  Name  Route  Sig  Dispense  Refill  . acetaminophen (TYLENOL) 325 MG tablet   Oral   Take 2 tablets (650 mg total) by mouth every 6 (six) hours as needed (or Fever >/= 101).         Marland Kitchen aspirin EC 81 MG EC tablet   Oral   Take 1 tablet (81 mg total) by mouth daily.         Marland Kitchen atorvastatin (LIPITOR) 20 MG tablet   Oral   Take 20 mg by mouth daily.         . Calcium-Vitamin D (CALTRATE 600 PLUS-VIT D PO)  Oral   Take by mouth.         . doxazosin (CARDURA) 4 MG tablet   Oral   Take 4 mg by mouth at bedtime.         . ergocalciferol (VITAMIN D2) 50000 UNITS capsule   Oral   Take 50,000 Units by mouth once a week.         . feeding supplement (ENSURE COMPLETE) LIQD   Oral   Take 237 mLs by mouth daily.         . hydrochlorothiazide (HYDRODIURIL) 12.5 MG tablet   Oral   Take 12.5 mg by mouth daily.         . memantine (NAMENDA) 5 MG tablet   Oral   Take 1 tablet (5 mg total) by mouth daily.           Increase by 5mg  until 10mg  bid weekly titration (f ...   . senna-docusate (SENOKOT-S) 8.6-50 MG per tablet   Oral   Take 1 tablet by mouth at bedtime as needed.         . zolpidem (AMBIEN) 5 MG tablet   Oral   Take 1 tablet (5 mg total)  by mouth at bedtime as needed for sleep (insomnia).   30 tablet       BP 125/75  Pulse 78  Temp(Src) 97.9 F (36.6 C) (Oral)  Resp 18  SpO2 100% Physical Exam  Nursing note and vitals reviewed. Constitutional: He is oriented to person, place, and time. He appears well-developed and well-nourished. No distress.  HENT:  Head: Normocephalic. Head is with laceration.    Mouth/Throat: Oropharynx is clear and moist.  Eyes: Conjunctivae and EOM are normal. Pupils are equal, round, and reactive to light.  Neck: Normal range of motion. Neck supple. No spinous process tenderness and no muscular tenderness present.  Cardiovascular: Normal rate, regular rhythm and intact distal pulses.   No murmur heard. Pulmonary/Chest: Effort normal and breath sounds normal. No respiratory distress. He has no wheezes. He has no rales.    Abdominal: Soft. He exhibits no distension. There is no tenderness. There is no rebound and no guarding.  Musculoskeletal: Normal range of motion. He exhibits no edema and no tenderness.       Right wrist: Normal.       Right hip: Normal.       Left hip: Normal.       Arms: Neurological: He is alert and  oriented to person, place, and time.  Skin: Skin is warm and dry. No rash noted. No erythema.  Psychiatric: He has a normal mood and affect. His behavior is normal.    ED Course  Procedures (including critical care time) Labs Review Labs Reviewed - No data to display Imaging Review Dg Hip Complete Right  08/08/2013   CLINICAL DATA:  Fall. Right hip pain.  EXAM: RIGHT HIP - COMPLETE 2+ VIEW  COMPARISON:  None.  FINDINGS: No fracture. The hip joint is normally space and aligned. There is calcification adjacent to the superior femoral head that may reflect chondrocalcinosis.  The bones are demineralized. The bony pelvis is intact.  IMPRESSION: No fracture or dislocation.   Electronically Signed   By: Amie Portland M.D.   On: 08/08/2013 11:47    EKG Interpretation   None      LACERATION REPAIR Performed by: Gwyneth Sprout Authorized by: Gwyneth Sprout Consent: Verbal consent obtained. Risks and benefits: risks, benefits and alternatives were discussed Consent given by: patient Patient identity confirmed: provided demographic data Prepped and Draped in normal sterile fashion Wound explored  Laceration Location: face  Laceration Length: 3cm  No Foreign Bodies seen or palpated  Anesthesia: local infiltration  Local anesthetic: lidocaine 2% with epinephrine  Anesthetic total: 2 ml  Irrigation method: syringe Amount of cleaning: standard  Skin closure: 6.0 prolene  Number of sutures: 4  Technique: simple interrupted  Patient tolerance: Patient tolerated the procedure well with no immediate complications.  MDM   1. Fall at nursing home, initial encounter   2. Facial laceration, initial encounter     Pt with mechanical fall this am out of bed due to not having help.  States he hit his face on the dresser when falling.  Laceration on the bridge of nose without epistaxis and no LOC, head injury or neck pain.  Pt able to ambulate after the fall.  No c/o of hip pain  able to range fully bilaterally.  Pt well appearing and at baseline, no anticoagulation.  Tetanus updated.  Laceration repaired.  At this point no need for imaging.  Will d/c home.   Gwyneth Sprout, MD 08/08/13 1205  Gwyneth Sprout, MD 08/08/13 1210

## 2013-08-08 NOTE — ED Notes (Signed)
Patient fell when getting out of bed this morning.  No LOC, patient c/o right hip pain also hit bridge of nose and has a small laceration.  Patient has a existing gait problem due to arthritis.  Alert and oriented lives at [facility].

## 2013-10-31 ENCOUNTER — Emergency Department (HOSPITAL_COMMUNITY): Payer: Medicare HMO

## 2013-10-31 ENCOUNTER — Emergency Department (HOSPITAL_COMMUNITY)
Admission: EM | Admit: 2013-10-31 | Discharge: 2013-11-01 | Disposition: A | Discharge status: Home / Self Care | Payer: Medicare HMO | Attending: Emergency Medicine | Admitting: Emergency Medicine

## 2013-10-31 ENCOUNTER — Encounter (HOSPITAL_COMMUNITY): Payer: Self-pay | Admitting: Emergency Medicine

## 2013-10-31 DIAGNOSIS — E876 Hypokalemia: Secondary | ICD-10-CM | POA: Diagnosis present

## 2013-10-31 DIAGNOSIS — Z79899 Other long term (current) drug therapy: Secondary | ICD-10-CM | POA: Insufficient documentation

## 2013-10-31 DIAGNOSIS — IMO0002 Reserved for concepts with insufficient information to code with codable children: Secondary | ICD-10-CM | POA: Insufficient documentation

## 2013-10-31 DIAGNOSIS — R05 Cough: Secondary | ICD-10-CM | POA: Insufficient documentation

## 2013-10-31 DIAGNOSIS — R4182 Altered mental status, unspecified: Secondary | ICD-10-CM | POA: Insufficient documentation

## 2013-10-31 DIAGNOSIS — M1990 Unspecified osteoarthritis, unspecified site: Secondary | ICD-10-CM | POA: Insufficient documentation

## 2013-10-31 DIAGNOSIS — R059 Cough, unspecified: Secondary | ICD-10-CM | POA: Insufficient documentation

## 2013-10-31 DIAGNOSIS — G9340 Encephalopathy, unspecified: Secondary | ICD-10-CM | POA: Diagnosis present

## 2013-10-31 DIAGNOSIS — E7800 Pure hypercholesterolemia, unspecified: Secondary | ICD-10-CM | POA: Insufficient documentation

## 2013-10-31 DIAGNOSIS — J40 Bronchitis, not specified as acute or chronic: Secondary | ICD-10-CM | POA: Diagnosis present

## 2013-10-31 DIAGNOSIS — M810 Age-related osteoporosis without current pathological fracture: Secondary | ICD-10-CM | POA: Insufficient documentation

## 2013-10-31 DIAGNOSIS — N400 Enlarged prostate without lower urinary tract symptoms: Secondary | ICD-10-CM | POA: Insufficient documentation

## 2013-10-31 DIAGNOSIS — G309 Alzheimer's disease, unspecified: Secondary | ICD-10-CM | POA: Insufficient documentation

## 2013-10-31 DIAGNOSIS — Z7982 Long term (current) use of aspirin: Secondary | ICD-10-CM | POA: Insufficient documentation

## 2013-10-31 DIAGNOSIS — F0280 Dementia in other diseases classified elsewhere without behavioral disturbance: Secondary | ICD-10-CM | POA: Insufficient documentation

## 2013-10-31 DIAGNOSIS — F0390 Unspecified dementia without behavioral disturbance: Secondary | ICD-10-CM | POA: Diagnosis present

## 2013-10-31 DIAGNOSIS — E860 Dehydration: Secondary | ICD-10-CM | POA: Insufficient documentation

## 2013-10-31 HISTORY — DX: Benign prostatic hyperplasia without lower urinary tract symptoms: N40.0

## 2013-10-31 HISTORY — DX: Alzheimer's disease, unspecified: G30.9

## 2013-10-31 HISTORY — DX: Cerebral infarction, unspecified: I63.9

## 2013-10-31 HISTORY — DX: Dementia in other diseases classified elsewhere, unspecified severity, without behavioral disturbance, psychotic disturbance, mood disturbance, and anxiety: F02.80

## 2013-10-31 NOTE — ED Notes (Signed)
Bed: WA14 Expected date:  Expected time:  Means of arrival:  Comments: EMS 

## 2013-10-31 NOTE — ED Notes (Signed)
Patient arrives via EMS due to AMS Patient recently dx with bronchitis Patient with hx of dementia and Alzheimer's

## 2013-11-01 ENCOUNTER — Emergency Department (HOSPITAL_COMMUNITY): Payer: Medicare HMO

## 2013-11-01 DIAGNOSIS — G9340 Encephalopathy, unspecified: Secondary | ICD-10-CM | POA: Diagnosis present

## 2013-11-01 DIAGNOSIS — J40 Bronchitis, not specified as acute or chronic: Secondary | ICD-10-CM | POA: Diagnosis present

## 2013-11-01 DIAGNOSIS — F0390 Unspecified dementia without behavioral disturbance: Secondary | ICD-10-CM | POA: Diagnosis present

## 2013-11-01 DIAGNOSIS — R279 Unspecified lack of coordination: Secondary | ICD-10-CM | POA: Diagnosis not present

## 2013-11-01 DIAGNOSIS — M6281 Muscle weakness (generalized): Secondary | ICD-10-CM | POA: Diagnosis not present

## 2013-11-01 DIAGNOSIS — E876 Hypokalemia: Secondary | ICD-10-CM | POA: Diagnosis present

## 2013-11-01 DIAGNOSIS — R2681 Unsteadiness on feet: Secondary | ICD-10-CM | POA: Diagnosis not present

## 2013-11-01 DIAGNOSIS — R69 Illness, unspecified: Secondary | ICD-10-CM | POA: Diagnosis not present

## 2013-11-01 LAB — CBC WITH DIFFERENTIAL/PLATELET
BASOS ABS: 0 10*3/uL (ref 0.0–0.1)
BASOS PCT: 0 % (ref 0–1)
Eosinophils Absolute: 0.1 10*3/uL (ref 0.0–0.7)
Eosinophils Relative: 1 % (ref 0–5)
HEMATOCRIT: 39.5 % (ref 39.0–52.0)
Hemoglobin: 13.5 g/dL (ref 13.0–17.0)
Lymphocytes Relative: 25 % (ref 12–46)
Lymphs Abs: 1.5 10*3/uL (ref 0.7–4.0)
MCH: 32.8 pg (ref 26.0–34.0)
MCHC: 34.2 g/dL (ref 30.0–36.0)
MCV: 95.9 fL (ref 78.0–100.0)
MONO ABS: 0.7 10*3/uL (ref 0.1–1.0)
Monocytes Relative: 11 % (ref 3–12)
NEUTROS ABS: 3.7 10*3/uL (ref 1.7–7.7)
Neutrophils Relative %: 62 % (ref 43–77)
PLATELETS: 148 10*3/uL — AB (ref 150–400)
RBC: 4.12 MIL/uL — ABNORMAL LOW (ref 4.22–5.81)
RDW: 13.3 % (ref 11.5–15.5)
WBC: 5.9 10*3/uL (ref 4.0–10.5)

## 2013-11-01 LAB — URINALYSIS, ROUTINE W REFLEX MICROSCOPIC
Bilirubin Urine: NEGATIVE
GLUCOSE, UA: NEGATIVE mg/dL
HGB URINE DIPSTICK: NEGATIVE
Ketones, ur: NEGATIVE mg/dL
Leukocytes, UA: NEGATIVE
Nitrite: NEGATIVE
Protein, ur: NEGATIVE mg/dL
SPECIFIC GRAVITY, URINE: 1.014 (ref 1.005–1.030)
Urobilinogen, UA: 0.2 mg/dL (ref 0.0–1.0)
pH: 7 (ref 5.0–8.0)

## 2013-11-01 LAB — COMPREHENSIVE METABOLIC PANEL
ALBUMIN: 3 g/dL — AB (ref 3.5–5.2)
ALT: 20 U/L (ref 0–53)
AST: 29 U/L (ref 0–37)
Alkaline Phosphatase: 72 U/L (ref 39–117)
BUN: 22 mg/dL (ref 6–23)
CALCIUM: 9.9 mg/dL (ref 8.4–10.5)
CHLORIDE: 98 meq/L (ref 96–112)
CO2: 31 mEq/L (ref 19–32)
CREATININE: 1.16 mg/dL (ref 0.50–1.35)
GFR calc Af Amer: 61 mL/min — ABNORMAL LOW (ref >90)
GFR calc non Af Amer: 53 mL/min — ABNORMAL LOW (ref >90)
Glucose, Bld: 99 mg/dL (ref 70–99)
Potassium: 3 mEq/L — ABNORMAL LOW (ref 3.7–5.3)
SODIUM: 140 meq/L (ref 137–147)
Total Bilirubin: 0.5 mg/dL (ref 0.3–1.2)
Total Protein: 6 g/dL (ref 6.0–8.3)

## 2013-11-01 LAB — CG4 I-STAT (LACTIC ACID): LACTIC ACID, VENOUS: 0.93 mmol/L (ref 0.5–2.2)

## 2013-11-01 MED ORDER — POTASSIUM CHLORIDE CRYS ER 20 MEQ PO TBCR
40.0000 meq | EXTENDED_RELEASE_TABLET | Freq: Once | ORAL | Status: DC
  Filled 2013-11-01: qty 2

## 2013-11-01 MED ORDER — POTASSIUM CHLORIDE IN NACL 20-0.9 MEQ/L-% IV SOLN
Freq: Once | INTRAVENOUS | Status: AC
  Administered 2013-11-01: 12:00:00 via INTRAVENOUS
  Filled 2013-11-01: qty 1000

## 2013-11-01 MED ORDER — LORAZEPAM 2 MG/ML IJ SOLN
0.5000 mg | Freq: Once | INTRAMUSCULAR | Status: AC
  Administered 2013-11-01: 0.5 mg via INTRAVENOUS
  Filled 2013-11-01: qty 1

## 2013-11-01 MED ORDER — SODIUM CHLORIDE 0.9 % IV BOLUS (SEPSIS)
500.0000 mL | Freq: Once | INTRAVENOUS | Status: AC
  Administered 2013-11-01: 500 mL via INTRAVENOUS

## 2013-11-01 MED ORDER — POTASSIUM CHLORIDE 10 MEQ/100ML IV SOLN
10.0000 meq | Freq: Once | INTRAVENOUS | Status: AC
  Administered 2013-11-01: 10 meq via INTRAVENOUS
  Filled 2013-11-01: qty 100

## 2013-11-01 NOTE — ED Notes (Signed)
MRI staff will attempt to call POA to complete check off list

## 2013-11-01 NOTE — ED Notes (Signed)
Patient taken to MRI via stretcher.

## 2013-11-01 NOTE — ED Notes (Signed)
MD at bedside. 

## 2013-11-01 NOTE — ED Notes (Signed)
Patient transported to MRI 

## 2013-11-01 NOTE — ED Provider Notes (Signed)
CSN: [id number]     Arrival date & time 10/31/13  2327 History   First MD Initiated Contact with Patient 10/31/13 2341     Chief Complaint  Patient presents with  . Altered Mental Status   (Consider location/radiation/quality/duration/timing/severity/associated sxs/prior Treatment) HPI  This is a 80 year old male with history of Alzheimer's, stroke who presents with altered mental status. Patient's son is at the bedside. Per EMS report, the patient was brought in from his living facility with increased somnolence and altered mental status. He was diagnosed with bronchitis earlier this week. There was no report of fever. Patient's son is at the bedside but is not aware of patient's recent medical history as the living facility has been in quarantine for flu. Patient's son does report that he may have fallen yesterday. Patient is largely noncontributory to history taking. He denies any pain at this time  Level V caveat applies  Past Medical History  Diagnosis Date  . High cholesterol   . Insomnia   . Osteoporosis   . Osteoarthritis   . Alzheimer's dementia   . Stroke   . BPH (benign prostatic hypertrophy)    Past Surgical History  Procedure Laterality Date  . Nephrectomy  1959    rt, but doesn't remember why  . Joint replacement      left knee   History reviewed. No pertinent family history. History  Substance Use Topics  . Smoking status: Never Smoker   . Smokeless tobacco: Never Used  . Alcohol Use: No    Review of Systems  Constitutional: Negative.  Negative for fever.  Respiratory: Positive for cough. Negative for chest tightness and shortness of breath.   Cardiovascular: Negative.  Negative for chest pain.  Gastrointestinal: Negative.  Negative for nausea, vomiting and abdominal pain.  Genitourinary: Negative.  Negative for dysuria.  Musculoskeletal: Negative for back pain.  Skin: Negative for rash.  Neurological: Negative for headaches.  Psychiatric/Behavioral:  Positive for confusion.  All other systems reviewed and are negative.    Allergies  Review of patient's allergies indicates no known allergies.  Home Medications   Current Outpatient Rx  Name  Route  Sig  Dispense  Refill  . aspirin EC 81 MG EC tablet   Oral   Take 1 tablet (81 mg total) by mouth daily.         Marland Kitchen. atorvastatin (LIPITOR) 20 MG tablet   Oral   Take 20 mg by mouth daily.         . Calcium-Vitamin D (CALTRATE 600 PLUS-VIT D PO)   Oral   Take 1 tablet by mouth 2 (two) times daily.          Marland Kitchen. doxazosin (CARDURA) 4 MG tablet   Oral   Take 4 mg by mouth at bedtime.         . ergocalciferol (VITAMIN D2) 50000 UNITS capsule   Oral   Take 50,000 Units by mouth once a week.         . fluticasone (FLONASE) 50 MCG/ACT nasal spray   Each Nare   Place 1 spray into both nostrils daily.         . hydrochlorothiazide (HYDRODIURIL) 12.5 MG tablet   Oral   Take 12.5 mg by mouth daily.         Marland Kitchen. loratadine (CLARITIN) 10 MG tablet   Oral   Take 10 mg by mouth daily.         . Melatonin 5 MG TABS   Oral  Take 5 mg by mouth at bedtime.         . Memantine HCl ER (NAMENDA XR) 28 MG CP24   Oral   Take 1 capsule by mouth every morning.         Marland Kitchen oseltamivir (TAMIFLU) 75 MG capsule   Oral   Take 75 mg by mouth daily.         . polyethylene glycol (MIRALAX / GLYCOLAX) packet   Oral   Take 17 g by mouth daily.         Marland Kitchen acetaminophen (TYLENOL) 325 MG tablet   Oral   Take 650 mg by mouth every 6 (six) hours as needed for pain.         Marland Kitchen senna-docusate (SENOKOT-S) 8.6-50 MG per tablet   Oral   Take 1 tablet by mouth at bedtime as needed for constipation.         Marland Kitchen zolpidem (AMBIEN) 5 MG tablet   Oral   Take 5 mg by mouth at bedtime as needed for sleep.          BP 124/72  Pulse 27  Temp(Src) 98.5 F (36.9 C) (Axillary)  Resp 20  SpO2 94% Physical Exam  Nursing note and vitals reviewed. Constitutional: He is oriented to  person, place, and time. No distress.  Elderly  HENT:  Head: Normocephalic and atraumatic.  Eyes: Pupils are equal, round, and reactive to light.  Neck: Neck supple.  Cardiovascular: Normal rate, regular rhythm and normal heart sounds.   No murmur heard. Pulmonary/Chest: Effort normal and breath sounds normal. No respiratory distress. He has no wheezes.  Abdominal: Soft. Bowel sounds are normal. There is no tenderness. There is no rebound.  Musculoskeletal: He exhibits no edema.  Lymphadenopathy:    He has no cervical adenopathy.  Neurological: He is alert and oriented to person, place, and time.  Skin: Skin is warm and dry. No rash noted.  Psychiatric: He has a normal mood and affect.    ED Course  Procedures (including critical care time) Labs Review Labs Reviewed  CBC WITH DIFFERENTIAL - Abnormal; Notable for the following:    RBC 4.12 (*)    Platelets 148 (*)    All other components within normal limits  COMPREHENSIVE METABOLIC PANEL - Abnormal; Notable for the following:    Potassium 3.0 (*)    Albumin 3.0 (*)    GFR calc non Af Amer 53 (*)    GFR calc Af Amer 61 (*)    All other components within normal limits  URINALYSIS, ROUTINE W REFLEX MICROSCOPIC  CG4 I-STAT (LACTIC ACID)   Imaging Review Ct Head Wo Contrast  11/01/2013   CLINICAL DATA:  Altered mental status  EXAM: CT HEAD WITHOUT CONTRAST  CT CERVICAL SPINE WITHOUT CONTRAST  TECHNIQUE: Multidetector CT imaging of the head and cervical spine was performed following the standard protocol without intravenous contrast. Multiplanar CT image reconstructions of the cervical spine were also generated.  COMPARISON:  08/31/2012  FINDINGS: CT HEAD FINDINGS  Skull and Sinuses:Mild diffuse paranasal sinus mucosal thickening.  Orbits: Bilateral cataract resection.  Brain: No evidence of acute abnormality, such as acute infarction, hemorrhage, hydrocephalus, or mass lesion/mass effect. Global brain atrophy. Stable pattern of chronic  small vessel ischemic white matter disease, with low-density confluent around the lateral ventricles.  CT CERVICAL SPINE FINDINGS  No evidence of acute fracture. There are retrolistheses at C3-4, C4-5, and C5-6. 2 mm anterolisthesis present at C7-T1. All these are associated with  advanced degenerative disc and facet disease. Extensive ligamentous thickening and ossification at the atlantodental joint, narrowing the upper spinal canal. There is crowding of the cord, without definitive compression. No prevertebral edema. No gross cervical canal hematoma.  IMPRESSION: 1. No evidence of acute intracranial disease. 2. Negative for cervical spine fracture. 3. Advanced degenerative disc and facet disease throughout the cervical spine.   Electronically Signed   By: Tiburcio Pea M.D.   On: 11/01/2013 01:10   Ct Cervical Spine Wo Contrast  11/01/2013   CLINICAL DATA:  Altered mental status  EXAM: CT HEAD WITHOUT CONTRAST  CT CERVICAL SPINE WITHOUT CONTRAST  TECHNIQUE: Multidetector CT imaging of the head and cervical spine was performed following the standard protocol without intravenous contrast. Multiplanar CT image reconstructions of the cervical spine were also generated.  COMPARISON:  08/31/2012  FINDINGS: CT HEAD FINDINGS  Skull and Sinuses:Mild diffuse paranasal sinus mucosal thickening.  Orbits: Bilateral cataract resection.  Brain: No evidence of acute abnormality, such as acute infarction, hemorrhage, hydrocephalus, or mass lesion/mass effect. Global brain atrophy. Stable pattern of chronic small vessel ischemic white matter disease, with low-density confluent around the lateral ventricles.  CT CERVICAL SPINE FINDINGS  No evidence of acute fracture. There are retrolistheses at C3-4, C4-5, and C5-6. 2 mm anterolisthesis present at C7-T1. All these are associated with advanced degenerative disc and facet disease. Extensive ligamentous thickening and ossification at the atlantodental joint, narrowing the upper  spinal canal. There is crowding of the cord, without definitive compression. No prevertebral edema. No gross cervical canal hematoma.  IMPRESSION: 1. No evidence of acute intracranial disease. 2. Negative for cervical spine fracture. 3. Advanced degenerative disc and facet disease throughout the cervical spine.   Electronically Signed   By: Tiburcio Pea M.D.   On: 11/01/2013 01:10   Dg Chest Portable 1 View  11/01/2013   CLINICAL DATA:  Altered mental status  EXAM: PORTABLE CHEST - 1 VIEW  COMPARISON:  08/30/2012  FINDINGS: Chronic cardiomegaly. Diffuse interstitial coarsening, similar to previous. No effusion or pneumothorax. No asymmetric opacity to suggest pneumonia.  IMPRESSION: Diffuse interstitial coarsening which could be bronchitic or congestive.   Electronically Signed   By: Tiburcio Pea M.D.   On: 11/01/2013 00:53    EKG Interpretation    Date/Time:  Monday November 01 2013 01:52:36 EST Ventricular Rate:  58 PR Interval:  183 QRS Duration: 107 QT Interval:  412 QTC Calculation: 405 R Axis:   -44 Text Interpretation:  Sinus rhythm Ventricular trigeminy Left axis deviation Low voltage, extremity leads Confirmed by HORTON  MD, COURTNEY ([id number]) on 11/01/2013 3:43:32 AM            MDM   1. Altered mental status   2. Dehydration    Patient presents from her living facility with altered mental status. He is nontoxic-appearing on exam. He is not following commands and is disoriented. There is no obvious facial droop or asymmetry. Basic labwork was obtained and is only notable for mild dehydration with an increase in his creatinine. I did talk to the nurse and cares for the patient at his living facility. He is currently on Tamiflu and just finished a course of Levaquin. He has had no fevers. Tamiflu can cause confusion and maybe partly to blame for the patient's mental status. Also at this time cannot rule out stroke given that his neurologic exam is difficult. Screening head CT is  negative. I discussed the patient with Dr. Clelia Croft.  Patient's primary physician Dr.  Jarold Motto will evaluate the patient at 7 AM and decide on admission versus discharge home.    Shon Baton, MD 11/01/13 [phone]

## 2013-11-01 NOTE — Discharge Instructions (Signed)
Altered Mental Status °Altered mental status most often refers to an abnormal change in your responsiveness and awareness. It can affect your speech, thought, mobility, memory, attention span, or alertness. It can range from slight confusion to complete unresponsiveness (coma). Altered mental status can be a sign of a serious underlying medical condition. Rapid evaluation and medical treatment is necessary for patients having an altered mental status. °CAUSES  °· Low blood sugar (hypoglycemia) or diabetes. °· Severe loss of body fluids (dehydration) or a body salt (electrolyte) imbalance. °· A stroke or other neurologic problem, such as dementia or delirium. °· A head injury or tumor. °· A drug or alcohol overdose. °· Exposure to toxins or poisons. °· Depression, anxiety, and stress. °· A low oxygen level (hypoxia). °· An infection. °· Blood loss. °· Twitching or shaking (seizure). °· Heart problems, such as heart attack or heart rhythm problems (arrhythmias). °· A body temperature that is too low or too high (hypothermia or hyperthermia). °DIAGNOSIS  °A diagnosis is based on your history, symptoms, physical and neurologic examinations, and diagnostic tests. Diagnostic tests may include: °· Measurement of your blood pressure, pulse, breathing, and oxygen levels (vital signs). °· Blood tests. °· Urine tests. °· X-ray exams. °· A computerized magnetic scan (magnetic resonance imaging, MRI). °· A computerized X-ray scan (computed tomography, CT scan). °TREATMENT  °Treatment will depend on the cause. Treatment may include: °· Management of an underlying medical or mental health condition. °· Critical care or support in the hospital. °HOME CARE INSTRUCTIONS  °· Only take over-the-counter or prescription medicines for pain, discomfort, or fever as directed by your caregiver. °· Manage underlying conditions as directed by your caregiver. °· Eat a healthy, well-balanced diet to maintain strength. °· Join a support group or  prevention program to cope with the condition or trauma that caused the altered mental status. Ask your caregiver to help choose a program that works for you. °· Follow up with your caregiver for further examination, therapy, or testing as directed. °SEEK MEDICAL CARE IF:  °· You feel unwell or have chills. °· You or your family notice a change in your behavior or your alertness. °· You have trouble following your caregiver's treatment plan. °· You have questions or concerns. °SEEK IMMEDIATE MEDICAL CARE IF:  °· You have a rapid heartbeat or have chest pain. °· You have difficulty breathing. °· You have a fever. °· You have a headache with a stiff neck. °· You cough up blood. °· You have blood in your urine or stool. °· You have severe agitation or confusion. °MAKE SURE YOU:  °· Understand these instructions. °· Will watch your condition. °· Will get help right away if you are not doing well or get worse. °Document Released: 04/03/2010 Document Revised: 01/06/2012 Document Reviewed: 04/03/2010 °ExitCare® Patient Information ©2014 [employer]. ° °

## 2013-11-01 NOTE — ED Provider Notes (Signed)
Care transferred to me. I discussed the patient with Dr. Jarold MottoPatterson, who is unable to see patient. He came by earlier in the ED but patient was in MRI. He is in clinic and unable to come to ER at this time. He recommends some gentle fluids and potassium replacement, as well as stopping tamiflu, which he thinks might be causing the AMS. Given that there is no stroke and that patient can be followed up within 24 hours by himself at the nursing home, he recommends discharge as long as vitals are stable.  Patient remains altered with some depressed mental status. He does wake up with stimulation but does not respond appropriately. I discussed this again with Dr. Jarold MottoPatterson, who given his knowledge of patient and the labs and MRI, wants patient to be discharged back to nursing home with IV fluid and potassium replacement. Will leave IV in with fluids going (NS with 20 K at 75/hour) and they will continue at nursing home.  Craig CamelScott T Brittany Amirault, MD 11/01/13 1125

## 2013-11-01 NOTE — ED Notes (Signed)
Patient transported to CT 

## 2013-12-02 ENCOUNTER — Encounter: Payer: Self-pay | Admitting: General Surgery

## 2015-04-13 ENCOUNTER — Emergency Department (HOSPITAL_COMMUNITY)
Admission: EM | Admit: 2015-04-13 | Discharge: 2015-04-13 | Disposition: A | Discharge status: Home / Self Care | Payer: Medicare HMO | Attending: Emergency Medicine | Admitting: Emergency Medicine

## 2015-04-13 ENCOUNTER — Encounter (HOSPITAL_COMMUNITY): Payer: Self-pay | Admitting: Emergency Medicine

## 2015-04-13 DIAGNOSIS — Z8673 Personal history of transient ischemic attack (TIA), and cerebral infarction without residual deficits: Secondary | ICD-10-CM | POA: Insufficient documentation

## 2015-04-13 DIAGNOSIS — Z79899 Other long term (current) drug therapy: Secondary | ICD-10-CM | POA: Insufficient documentation

## 2015-04-13 DIAGNOSIS — E780 Pure hypercholesterolemia: Secondary | ICD-10-CM | POA: Diagnosis not present

## 2015-04-13 DIAGNOSIS — I10 Essential (primary) hypertension: Secondary | ICD-10-CM | POA: Insufficient documentation

## 2015-04-13 DIAGNOSIS — M1990 Unspecified osteoarthritis, unspecified site: Secondary | ICD-10-CM | POA: Diagnosis not present

## 2015-04-13 DIAGNOSIS — R609 Edema, unspecified: Secondary | ICD-10-CM | POA: Insufficient documentation

## 2015-04-13 DIAGNOSIS — Z7982 Long term (current) use of aspirin: Secondary | ICD-10-CM | POA: Diagnosis not present

## 2015-04-13 DIAGNOSIS — G309 Alzheimer's disease, unspecified: Secondary | ICD-10-CM | POA: Insufficient documentation

## 2015-04-13 DIAGNOSIS — R600 Localized edema: Secondary | ICD-10-CM

## 2015-04-13 DIAGNOSIS — R2243 Localized swelling, mass and lump, lower limb, bilateral: Secondary | ICD-10-CM | POA: Diagnosis present

## 2015-04-13 DIAGNOSIS — G4700 Insomnia, unspecified: Secondary | ICD-10-CM | POA: Insufficient documentation

## 2015-04-13 DIAGNOSIS — F0280 Dementia in other diseases classified elsewhere without behavioral disturbance: Secondary | ICD-10-CM | POA: Diagnosis not present

## 2015-04-13 DIAGNOSIS — E559 Vitamin D deficiency, unspecified: Secondary | ICD-10-CM | POA: Diagnosis not present

## 2015-04-13 LAB — BASIC METABOLIC PANEL
Anion gap: 10 (ref 5–15)
BUN: 10 mg/dL (ref 6–20)
CALCIUM: 8.6 mg/dL — AB (ref 8.9–10.3)
CO2: 26 mmol/L (ref 22–32)
Chloride: 94 mmol/L — ABNORMAL LOW (ref 101–111)
Creatinine, Ser: 0.83 mg/dL (ref 0.61–1.24)
GFR calc Af Amer: >60 mL/min (ref >60)
GLUCOSE: 97 mg/dL (ref 65–99)
Potassium: 4 mmol/L (ref 3.5–5.1)
Sodium: 130 mmol/L — ABNORMAL LOW (ref 135–145)

## 2015-04-13 LAB — CBC
HEMATOCRIT: 32.1 % — AB (ref 39.0–52.0)
HEMOGLOBIN: 10.2 g/dL — AB (ref 13.0–17.0)
MCH: 27.6 pg (ref 26.0–34.0)
MCHC: 31.8 g/dL (ref 30.0–36.0)
MCV: 87 fL (ref 78.0–100.0)
Platelets: 218 10*3/uL (ref 150–400)
RBC: 3.69 MIL/uL — AB (ref 4.22–5.81)
RDW: 14.2 % (ref 11.5–15.5)
WBC: 8.2 10*3/uL (ref 4.0–10.5)

## 2015-04-13 MED ORDER — FUROSEMIDE 10 MG/ML IJ SOLN
40.0000 mg | Freq: Once | INTRAMUSCULAR | Status: AC
  Administered 2015-04-13: 40 mg via INTRAVENOUS
  Filled 2015-04-13: qty 4

## 2015-04-13 MED ORDER — FUROSEMIDE 20 MG PO TABS: 20.0000 mg | ORAL_TABLET | Freq: Every day | ORAL | Status: AC

## 2015-04-13 NOTE — Discharge Instructions (Signed)
Peripheral Edema °You have swelling in your legs (peripheral edema). This swelling is due to excess accumulation of salt and water in your body. Edema may be a sign of heart, kidney or liver disease, or a side effect of a medication. It may also be due to problems in the leg veins. Elevating your legs and using special support stockings may be very helpful, if the cause of the swelling is due to poor venous circulation. Avoid long periods of standing, whatever the cause. °Treatment of edema depends on identifying the cause. Chips, pretzels, pickles and other salty foods should be avoided. Restricting salt in your diet is almost always needed. Water pills (diuretics) are often used to remove the excess salt and water from your body via urine. These medicines prevent the kidney from reabsorbing sodium. This increases urine flow. °Diuretic treatment may also result in lowering of potassium levels in your body. Potassium supplements may be needed if you have to use diuretics daily. Daily weights can help you keep track of your progress in clearing your edema. You should call your caregiver for follow up care as recommended. °SEEK IMMEDIATE MEDICAL CARE IF:  °· You have increased swelling, pain, redness, or heat in your legs. °· You develop shortness of breath, especially when lying down. °· You develop chest or abdominal pain, weakness, or fainting. °· You have a fever. °Document Released: 11/21/2004 Document Revised: 01/06/2012 Document Reviewed: 11/01/2009 °ExitCare® Patient Information ©2015 [employer]. This information is not intended to replace advice given to you by your health care provider. Make sure you discuss any questions you have with your health care provider. ° °

## 2015-04-13 NOTE — ED Notes (Signed)
Pt presents from [facility] by [facility] for BLE edema; L leg more swollen than right with weeping and cool to touch; no known history of CHF or PVD; pt does not c/o pain or otherwise at this time; VSS per [facility]

## 2015-04-13 NOTE — ED Provider Notes (Signed)
TIME SEEN: 3:31 AM  CHIEF COMPLAINT: Leg Swelling  HPI:  HPI Comments: Craig White is a 81 y.o. male with history of Alzheimer's dementia, hypertension, hyperlipidemia who presents to the Emergency Department complaining of bilateral lower extremity swelling. Pt was brought in from [facility] for the swelling. Discussed with nursing staff at nursing home who reports patient has been wearing Ted hose for several days. Removed the hose tonight they noticed that his left leg appeared to be white and they had a hard time feeling a pulse. They called the doctor on call who instructed them to bring the patient to the hospital. Pt denies any pain to his legs. Denies chest pain, shortness of breath, or any other associated symptoms. Pt is Alert and Ox3 but states he thinks he is here because of lower back pain that he has had for several days. Denies injury to the back. Denies new numbness or focal weakness. Denies bowel or bladder incontinence.   ROS: See HPI Constitutional: no fever  Eyes: no drainage  ENT: no runny nose   Cardiovascular:  no chest pain  Resp: no SOB  GI: no vomiting GU: no dysuria Integumentary: no rash  Allergy: no hives  Musculoskeletal: leg swelling  Neurological: no slurred speech ROS otherwise negative  PAST MEDICAL HISTORY/PAST SURGICAL HISTORY:  Past Medical History  Diagnosis Date  . High cholesterol   . Insomnia   . Osteoporosis   . Osteoarthritis   . Alzheimer's dementia   . Stroke   . BPH (benign prostatic hypertrophy)   . Hypertension   . Chronic confusion   . Risk for falls   . Protein calorie malnutrition   . Vitamin D deficiency     MEDICATIONS:  Prior to Admission medications   Medication Sig Start Date End Date Taking? Authorizing Provider  acetaminophen (TYLENOL) 325 MG tablet Take 650 mg by mouth every 6 (six) hours as needed for pain.    Historical Provider, MD  aspirin EC 81 MG EC tablet Take 1 tablet (81 mg total) by mouth  daily. 09/02/12   Rich Tisovec, MD  atorvastatin (LIPITOR) 20 MG tablet Take 20 mg by mouth daily.    Historical Provider, MD  Calcium-Vitamin D (CALTRATE 600 PLUS-VIT D PO) Take 1 tablet by mouth 2 (two) times daily.     Historical Provider, MD  doxazosin (CARDURA) 4 MG tablet Take 4 mg by mouth at bedtime.    Historical Provider, MD  ergocalciferol (VITAMIN D2) 50000 UNITS capsule Take 50,000 Units by mouth once a week.    Historical Provider, MD  fluticasone (FLONASE) 50 MCG/ACT nasal spray Place 1 spray into both nostrils daily.    Historical Provider, MD  hydrochlorothiazide (HYDRODIURIL) 12.5 MG tablet Take 12.5 mg by mouth daily.    Historical Provider, MD  loratadine (CLARITIN) 10 MG tablet Take 10 mg by mouth daily.    Historical Provider, MD  Melatonin 5 MG TABS Take 5 mg by mouth at bedtime.    Historical Provider, MD  Memantine HCl ER (NAMENDA XR) 28 MG CP24 Take 1 capsule by mouth every morning.    Historical Provider, MD  polyethylene glycol (MIRALAX / GLYCOLAX) packet Take 17 g by mouth daily.    Historical Provider, MD  senna-docusate (SENOKOT-S) 8.6-50 MG per tablet Take 1 tablet by mouth at bedtime as needed for constipation.    Historical Provider, MD  zolpidem (AMBIEN) 5 MG tablet Take 5 mg by mouth at bedtime as needed for sleep.  Historical Provider, MD    ALLERGIES:  No Known Allergies  SOCIAL HISTORY:  History  Substance Use Topics  . Smoking status: Never Smoker   . Smokeless tobacco: Never Used  . Alcohol Use: No    FAMILY HISTORY: History reviewed. No pertinent family history.  EXAM: Triage Vitals: BP 108/55 mmHg  Pulse 84  Temp(Src) 97.8 F (36.6 C) (Oral)  Resp 16  SpO2 93%   CONSTITUTIONAL: Alert and oriented and responds appropriately to questions. Well-appearing; well-nourished, elderly, pleasant, in no distress HEAD: Normocephalic EYES: Conjunctivae clear, PERRL ENT: normal nose; no rhinorrhea; moist mucous membranes; pharynx without lesions  noted NECK: Supple, no meningismus, no LAD  CARD: RRR; S1 and S2 appreciated; no murmurs, no clicks, no rubs, no gallops RESP: Normal chest excursion without splinting or tachypnea; breath sounds clear and equal bilaterally; no wheezes, no rhonchi, no rales, no hypoxia or respiratory distress, speaking full sentences ABD/GI: Normal bowel sounds; non-distended; soft, non-tender, no rebound, no guarding, no peritoneal signs BACK:  The back appears normal and is non-tender to palpation, there is no CVA tenderness EXT: Normal ROM in all joints; non-tender to palpation; normal capillary refill; no cyanosis, no calf tenderness. Doppler able strong biphasic DP pulses. 3+ pitting edema to distal calf bilaterally with equal swelling bilaterally. LEs warm and well perfused. SKIN: Normal color for age and race; warm NEURO: Moves all extremities equally, sensation to light touch intact diffusely, cranial nerves II through XII intact PSYCH: The patient's mood and manner are appropriate. Grooming and personal hygiene are appropriate.  MEDICAL DECISION MAKING: Patient here with bilateral lower extremity swelling. At his nursing home he had a cool, white appearing left leg with decreased palpable pulse. On exam he has pitting edema bilaterally but I am able to Doppler his pulses easily and they are strong, biphasic.  His extremities are warm and well-perfused without any discoloration. He has no calf tenderness. Equal swelling in his lower extremities bilaterally. Denies chest pain or shortness of breath. He is afebrile and hemodynamically stable. I feel he is safe to go back to his nursing facility. Will give dose of Lasix in the ED and discharged with a short prescription for Lasix to help with his lower extremity edema. Discussed this with nursing staff at his nursing home as well. Labs performed in triage show no significant abnormality. I feel he is safe to go back to his nursing home. Discussed return precautions  with patient who verbalized understanding and is comfortable with plan.      Layla Maw Ward, DO 04/13/15 [phone]

## 2015-04-13 NOTE — ED Notes (Signed)
Pt. Left with all belongings 

## 2015-10-30 DIAGNOSIS — M545 Low back pain: Secondary | ICD-10-CM | POA: Diagnosis not present

## 2015-10-30 DIAGNOSIS — D6489 Other specified anemias: Secondary | ICD-10-CM | POA: Diagnosis not present

## 2015-10-30 DIAGNOSIS — G308 Other Alzheimer's disease: Secondary | ICD-10-CM | POA: Diagnosis not present

## 2015-11-01 DIAGNOSIS — I739 Peripheral vascular disease, unspecified: Secondary | ICD-10-CM | POA: Diagnosis not present

## 2015-11-01 DIAGNOSIS — B351 Tinea unguium: Secondary | ICD-10-CM | POA: Diagnosis not present

## 2015-11-01 DIAGNOSIS — M1990 Unspecified osteoarthritis, unspecified site: Secondary | ICD-10-CM | POA: Diagnosis not present

## 2015-11-13 DIAGNOSIS — Z79899 Other long term (current) drug therapy: Secondary | ICD-10-CM | POA: Diagnosis not present

## 2015-11-16 DIAGNOSIS — E039 Hypothyroidism, unspecified: Secondary | ICD-10-CM | POA: Diagnosis not present

## 2015-11-16 DIAGNOSIS — E559 Vitamin D deficiency, unspecified: Secondary | ICD-10-CM | POA: Diagnosis not present

## 2016-01-05 DIAGNOSIS — R05 Cough: Secondary | ICD-10-CM | POA: Diagnosis not present

## 2016-01-05 DIAGNOSIS — J811 Chronic pulmonary edema: Secondary | ICD-10-CM | POA: Diagnosis not present

## 2016-01-10 DIAGNOSIS — R69 Illness, unspecified: Secondary | ICD-10-CM | POA: Diagnosis not present

## 2016-01-10 DIAGNOSIS — R05 Cough: Secondary | ICD-10-CM | POA: Diagnosis not present

## 2016-01-10 DIAGNOSIS — R0902 Hypoxemia: Secondary | ICD-10-CM | POA: Diagnosis not present

## 2016-01-10 DIAGNOSIS — R0689 Other abnormalities of breathing: Secondary | ICD-10-CM | POA: Diagnosis not present

## 2016-01-10 DIAGNOSIS — R5383 Other fatigue: Secondary | ICD-10-CM | POA: Diagnosis not present

## 2016-01-12 DIAGNOSIS — N390 Urinary tract infection, site not specified: Secondary | ICD-10-CM | POA: Diagnosis not present

## 2016-01-31 DIAGNOSIS — I739 Peripheral vascular disease, unspecified: Secondary | ICD-10-CM | POA: Diagnosis not present

## 2016-01-31 DIAGNOSIS — B351 Tinea unguium: Secondary | ICD-10-CM | POA: Diagnosis not present

## 2016-01-31 DIAGNOSIS — M79671 Pain in right foot: Secondary | ICD-10-CM | POA: Diagnosis not present

## 2016-01-31 DIAGNOSIS — M79672 Pain in left foot: Secondary | ICD-10-CM | POA: Diagnosis not present

## 2016-03-02 DIAGNOSIS — G308 Other Alzheimer's disease: Secondary | ICD-10-CM | POA: Diagnosis not present

## 2016-03-02 DIAGNOSIS — I10 Essential (primary) hypertension: Secondary | ICD-10-CM | POA: Diagnosis not present

## 2016-05-10 DIAGNOSIS — Z79899 Other long term (current) drug therapy: Secondary | ICD-10-CM | POA: Diagnosis not present

## 2016-05-22 DIAGNOSIS — M79671 Pain in right foot: Secondary | ICD-10-CM | POA: Diagnosis not present

## 2016-05-22 DIAGNOSIS — M79672 Pain in left foot: Secondary | ICD-10-CM | POA: Diagnosis not present

## 2016-05-22 DIAGNOSIS — I739 Peripheral vascular disease, unspecified: Secondary | ICD-10-CM | POA: Diagnosis not present

## 2016-05-22 DIAGNOSIS — B351 Tinea unguium: Secondary | ICD-10-CM | POA: Diagnosis not present

## 2016-06-25 DIAGNOSIS — R69 Illness, unspecified: Secondary | ICD-10-CM | POA: Diagnosis not present

## 2016-06-25 DIAGNOSIS — R279 Unspecified lack of coordination: Secondary | ICD-10-CM | POA: Diagnosis not present

## 2016-06-25 DIAGNOSIS — R2681 Unsteadiness on feet: Secondary | ICD-10-CM | POA: Diagnosis not present

## 2016-06-27 DIAGNOSIS — Z7951 Long term (current) use of inhaled steroids: Secondary | ICD-10-CM | POA: Diagnosis not present

## 2016-06-27 DIAGNOSIS — Z961 Presence of intraocular lens: Secondary | ICD-10-CM | POA: Diagnosis not present

## 2016-06-27 DIAGNOSIS — H353132 Nonexudative age-related macular degeneration, bilateral, intermediate dry stage: Secondary | ICD-10-CM | POA: Diagnosis not present

## 2016-06-27 DIAGNOSIS — Z7952 Long term (current) use of systemic steroids: Secondary | ICD-10-CM | POA: Diagnosis not present

## 2016-07-18 DIAGNOSIS — Z79899 Other long term (current) drug therapy: Secondary | ICD-10-CM | POA: Diagnosis not present

## 2016-07-20 DIAGNOSIS — Z79899 Other long term (current) drug therapy: Secondary | ICD-10-CM | POA: Diagnosis not present

## 2016-08-19 DIAGNOSIS — R69 Illness, unspecified: Secondary | ICD-10-CM | POA: Diagnosis not present

## 2016-08-19 DIAGNOSIS — G4700 Insomnia, unspecified: Secondary | ICD-10-CM | POA: Diagnosis not present

## 2016-08-21 DIAGNOSIS — N390 Urinary tract infection, site not specified: Secondary | ICD-10-CM | POA: Diagnosis not present

## 2016-09-03 DIAGNOSIS — R69 Illness, unspecified: Secondary | ICD-10-CM | POA: Diagnosis not present

## 2016-09-03 DIAGNOSIS — G4700 Insomnia, unspecified: Secondary | ICD-10-CM | POA: Diagnosis not present

## 2016-09-18 DIAGNOSIS — I739 Peripheral vascular disease, unspecified: Secondary | ICD-10-CM | POA: Diagnosis not present

## 2016-09-18 DIAGNOSIS — M79674 Pain in right toe(s): Secondary | ICD-10-CM | POA: Diagnosis not present

## 2016-09-18 DIAGNOSIS — B351 Tinea unguium: Secondary | ICD-10-CM | POA: Diagnosis not present

## 2016-09-18 DIAGNOSIS — I70203 Unspecified atherosclerosis of native arteries of extremities, bilateral legs: Secondary | ICD-10-CM | POA: Diagnosis not present

## 2016-09-20 DIAGNOSIS — R0602 Shortness of breath: Secondary | ICD-10-CM | POA: Diagnosis not present

## 2016-10-01 DIAGNOSIS — G4700 Insomnia, unspecified: Secondary | ICD-10-CM | POA: Diagnosis not present

## 2016-10-01 DIAGNOSIS — R69 Illness, unspecified: Secondary | ICD-10-CM | POA: Diagnosis not present

## 2016-11-06 DIAGNOSIS — E039 Hypothyroidism, unspecified: Secondary | ICD-10-CM | POA: Diagnosis not present

## 2016-11-06 DIAGNOSIS — Z79899 Other long term (current) drug therapy: Secondary | ICD-10-CM | POA: Diagnosis not present

## 2016-11-14 DIAGNOSIS — M1990 Unspecified osteoarthritis, unspecified site: Secondary | ICD-10-CM | POA: Diagnosis not present

## 2016-11-14 DIAGNOSIS — I739 Peripheral vascular disease, unspecified: Secondary | ICD-10-CM | POA: Diagnosis not present

## 2016-11-14 DIAGNOSIS — E785 Hyperlipidemia, unspecified: Secondary | ICD-10-CM | POA: Diagnosis not present

## 2016-11-14 DIAGNOSIS — H9190 Unspecified hearing loss, unspecified ear: Secondary | ICD-10-CM | POA: Diagnosis not present

## 2016-11-14 DIAGNOSIS — K219 Gastro-esophageal reflux disease without esophagitis: Secondary | ICD-10-CM | POA: Diagnosis not present

## 2016-11-14 DIAGNOSIS — R69 Illness, unspecified: Secondary | ICD-10-CM | POA: Diagnosis not present

## 2016-11-14 DIAGNOSIS — R4182 Altered mental status, unspecified: Secondary | ICD-10-CM | POA: Diagnosis not present

## 2016-11-14 DIAGNOSIS — R3912 Poor urinary stream: Secondary | ICD-10-CM | POA: Diagnosis not present

## 2016-12-05 DIAGNOSIS — Z7952 Long term (current) use of systemic steroids: Secondary | ICD-10-CM | POA: Diagnosis not present

## 2016-12-05 DIAGNOSIS — Z961 Presence of intraocular lens: Secondary | ICD-10-CM | POA: Diagnosis not present

## 2016-12-05 DIAGNOSIS — Z7951 Long term (current) use of inhaled steroids: Secondary | ICD-10-CM | POA: Diagnosis not present

## 2016-12-05 DIAGNOSIS — H353132 Nonexudative age-related macular degeneration, bilateral, intermediate dry stage: Secondary | ICD-10-CM | POA: Diagnosis not present

## 2016-12-24 DIAGNOSIS — F39 Unspecified mood [affective] disorder: Secondary | ICD-10-CM | POA: Diagnosis not present

## 2016-12-24 DIAGNOSIS — R69 Illness, unspecified: Secondary | ICD-10-CM | POA: Diagnosis not present

## 2016-12-24 DIAGNOSIS — F411 Generalized anxiety disorder: Secondary | ICD-10-CM | POA: Diagnosis not present

## 2016-12-24 DIAGNOSIS — G4700 Insomnia, unspecified: Secondary | ICD-10-CM | POA: Diagnosis not present

## 2017-01-22 DIAGNOSIS — I739 Peripheral vascular disease, unspecified: Secondary | ICD-10-CM | POA: Diagnosis not present

## 2017-01-22 DIAGNOSIS — B351 Tinea unguium: Secondary | ICD-10-CM | POA: Diagnosis not present

## 2017-03-11 DIAGNOSIS — F39 Unspecified mood [affective] disorder: Secondary | ICD-10-CM | POA: Diagnosis not present

## 2017-03-11 DIAGNOSIS — F411 Generalized anxiety disorder: Secondary | ICD-10-CM | POA: Diagnosis not present

## 2017-03-11 DIAGNOSIS — G4700 Insomnia, unspecified: Secondary | ICD-10-CM | POA: Diagnosis not present

## 2017-03-11 DIAGNOSIS — R69 Illness, unspecified: Secondary | ICD-10-CM | POA: Diagnosis not present

## 2017-05-06 DIAGNOSIS — Z79899 Other long term (current) drug therapy: Secondary | ICD-10-CM | POA: Diagnosis not present

## 2017-05-18 ENCOUNTER — Encounter (HOSPITAL_COMMUNITY): Payer: Self-pay | Admitting: Emergency Medicine

## 2017-05-18 ENCOUNTER — Emergency Department (HOSPITAL_COMMUNITY): Payer: Medicare HMO

## 2017-05-18 ENCOUNTER — Emergency Department (HOSPITAL_COMMUNITY)
Admission: EM | Admit: 2017-05-18 | Discharge: 2017-05-19 | Disposition: A | Discharge status: Home / Self Care | Payer: Medicare HMO | Attending: Emergency Medicine | Admitting: Emergency Medicine

## 2017-05-18 DIAGNOSIS — Z79899 Other long term (current) drug therapy: Secondary | ICD-10-CM | POA: Diagnosis not present

## 2017-05-18 DIAGNOSIS — S61211A Laceration without foreign body of left index finger without damage to nail, initial encounter: Secondary | ICD-10-CM | POA: Insufficient documentation

## 2017-05-18 DIAGNOSIS — W19XXXA Unspecified fall, initial encounter: Secondary | ICD-10-CM

## 2017-05-18 DIAGNOSIS — M79642 Pain in left hand: Secondary | ICD-10-CM

## 2017-05-18 DIAGNOSIS — Y92121 Bathroom in nursing home as the place of occurrence of the external cause: Secondary | ICD-10-CM | POA: Insufficient documentation

## 2017-05-18 DIAGNOSIS — S61412A Laceration without foreign body of left hand, initial encounter: Secondary | ICD-10-CM

## 2017-05-18 DIAGNOSIS — S30811A Abrasion of abdominal wall, initial encounter: Secondary | ICD-10-CM | POA: Insufficient documentation

## 2017-05-18 DIAGNOSIS — Y999 Unspecified external cause status: Secondary | ICD-10-CM | POA: Diagnosis not present

## 2017-05-18 DIAGNOSIS — W1830XA Fall on same level, unspecified, initial encounter: Secondary | ICD-10-CM | POA: Insufficient documentation

## 2017-05-18 DIAGNOSIS — Y939 Activity, unspecified: Secondary | ICD-10-CM | POA: Diagnosis not present

## 2017-05-18 DIAGNOSIS — Z7982 Long term (current) use of aspirin: Secondary | ICD-10-CM | POA: Insufficient documentation

## 2017-05-18 DIAGNOSIS — G309 Alzheimer's disease, unspecified: Secondary | ICD-10-CM | POA: Diagnosis not present

## 2017-05-18 DIAGNOSIS — I10 Essential (primary) hypertension: Secondary | ICD-10-CM | POA: Diagnosis not present

## 2017-05-18 DIAGNOSIS — S6990XA Unspecified injury of unspecified wrist, hand and finger(s), initial encounter: Secondary | ICD-10-CM | POA: Diagnosis not present

## 2017-05-18 DIAGNOSIS — R4182 Altered mental status, unspecified: Secondary | ICD-10-CM | POA: Diagnosis not present

## 2017-05-18 NOTE — ED Notes (Signed)
Bed: [id number] Expected date:  Expected time:  Means of arrival:  Comments: 83 yo m/ fall

## 2017-05-18 NOTE — ED Triage Notes (Signed)
[facility] after his unwitnessed fall tonight.  Pt was found lying on bathroom floor by staff.  Pt sustained laceration to left hand (index finger) and abrasion to right flank area---- bleeding to index finger laceration controlled with pressure dressing.  Pt a poor historian--- has Alzheimer's Dementia.

## 2017-05-19 DIAGNOSIS — R4182 Altered mental status, unspecified: Secondary | ICD-10-CM | POA: Diagnosis not present

## 2017-05-19 DIAGNOSIS — S6990XA Unspecified injury of unspecified wrist, hand and finger(s), initial encounter: Secondary | ICD-10-CM | POA: Diagnosis not present

## 2017-05-19 MED ORDER — BACITRACIN ZINC 500 UNIT/GM EX OINT: 1.0000 "application " | TOPICAL_OINTMENT | Freq: Two times a day (BID) | CUTANEOUS | 1 refills | Status: AC

## 2017-05-19 NOTE — ED Notes (Signed)
[facility] was called for pt's transportation back to [facility]. 

## 2017-05-19 NOTE — ED Provider Notes (Signed)
[facility]-EMERGENCY DEPT Provider Note   CSN: [id number] Arrival date & time: 05/18/17  2125     History   Chief Complaint Chief Complaint  Patient presents with  . Fall  . Laceration    HPI Craig White is a 83 y.o. male.  Level V caveat: Dementia   Craig White is a 83 y.o. Male who presents to the emergency department from his nursing facility after a fall in the bathroom. This was unwitnessed. He was found on the bathroom floor. Patient's daughter who is power of attorney is at bedside. Patient denies any complaints. He is noted to have a laceration to his left hand. Daughter reports that at times his speech has been somewhat different. No fevers have been noted or recent illness. He doesn't relate to the bathroom by himself at home. Long history of dementia.   The history is provided by the patient, a relative and medical records. No language interpreter was used.  Fall   Laceration      Past Medical History:  Diagnosis Date  . Alzheimer's dementia   . BPH (benign prostatic hypertrophy)   . Chronic confusion   . High cholesterol   . Hypertension   . Insomnia   . Osteoarthritis   . Osteoporosis   . Protein calorie malnutrition (HCC)   . Risk for falls   . Stroke (HCC)   . Vitamin D deficiency     Patient Active Problem List   Diagnosis Date Noted  . Dementia 11/01/2013  . Hypokalemia 11/01/2013  . Encephalopathy 11/01/2013  . Bronchitis 11/01/2013    Past Surgical History:  Procedure Laterality Date  . JOINT REPLACEMENT     left knee  . NEPHRECTOMY  1959   rt, but doesn't remember why       Home Medications    Prior to Admission medications   Medication Sig Start Date End Date Taking? Authorizing Provider  acetaminophen (TYLENOL) 325 MG tablet Take 650 mg by mouth every 6 (six) hours as needed for pain.    [provider]  aspirin EC 81 MG EC tablet Take 1 tablet (81 mg total) by mouth daily. 09/02/12   Tisovec, Adelfa Kohichard W, MD  atorvastatin  (LIPITOR) 20 MG tablet Take 20 mg by mouth daily.    [provider]  bacitracin ointment Apply 1 application topically 2 (two) times daily. 05/19/17   Everlene Farrieransie, Douglas Smolinsky, PA-C  Calcium-Vitamin D (CALTRATE 600 PLUS-VIT D PO) Take 1 tablet by mouth 2 (two) times daily.     [provider]  doxazosin (CARDURA) 4 MG tablet Take 4 mg by mouth at bedtime.    [provider]  ergocalciferol (VITAMIN D2) 50000 UNITS capsule Take 50,000 Units by mouth once a week.    [provider]  fluticasone (FLONASE) 50 MCG/ACT nasal spray Place 1 spray into both nostrils daily.    [provider]  furosemide (LASIX) 20 MG tablet Take 1 tablet (20 mg total) by mouth daily. 04/13/15   Ward, Layla MawKristen N, DO  hydrochlorothiazide (HYDRODIURIL) 12.5 MG tablet Take 12.5 mg by mouth daily.    [provider]  loratadine (CLARITIN) 10 MG tablet Take 10 mg by mouth daily.    [provider]  Melatonin 5 MG TABS Take 5 mg by mouth at bedtime.    [provider]  Memantine HCl ER (NAMENDA XR) 28 MG CP24 Take 1 capsule by mouth every morning.    [provider]  polyethylene glycol (MIRALAX /  GLYCOLAX) packet Take 17 g by mouth daily.    [provider]  senna-docusate (SENOKOT-S) 8.6-50 MG per tablet Take 1 tablet by mouth at bedtime as needed for constipation.    [provider]  zolpidem (AMBIEN) 5 MG tablet Take 5 mg by mouth at bedtime as needed for sleep.    [provider]    Family History History reviewed. No pertinent family history.  Social History Social History  Substance Use Topics  . Smoking status: Never Smoker  . Smokeless tobacco: Never Used  . Alcohol use No     Allergies   Patient has no known allergies.   Review of Systems Review of Systems  Unable to perform ROS: Dementia     Physical Exam Updated Vital Signs BP (!) 144/91   Pulse 85   Temp 98 F (36.7 C) (Oral)   Resp 15   SpO2 96%    Physical Exam  Constitutional: He appears well-developed and well-nourished. No distress.  Nontoxic appearing.  HENT:  Head: Normocephalic and atraumatic.  Right Ear: External ear normal.  Left Ear: External ear normal.  Mouth/Throat: Oropharynx is clear and moist.  Bilateral tympanic membranes are pearly-gray without erythema or loss of landmarks. No hemotympanum. No visible or palpated signs of head injury or trauma.   Eyes: Pupils are equal, round, and reactive to light. Conjunctivae are normal. Right eye exhibits no discharge. Left eye exhibits no discharge.  Neck: Neck supple.  Cardiovascular: Normal rate, regular rhythm, normal heart sounds and intact distal pulses.   Pulmonary/Chest: Effort normal and breath sounds normal. No respiratory distress. He has no wheezes. He has no rales. He exhibits no tenderness.  Abdominal: Soft. There is no tenderness.  Genitourinary:  Genitourinary Comments: No GU rashes.  Musculoskeletal: Normal range of motion. He exhibits no edema or deformity.  Skin tear noted to the dorsal aspect of his left hand by his second MCP joint. Good range of motion of his fingers without difficulty. No deformity. Good capillary refill. No midline neck or back tenderness. No abrasions or chest wall tenderness to palpation.  Lymphadenopathy:    He has no cervical adenopathy.  Neurological: He is alert. Coordination normal.  Patient is alert. He is demented. Speech is clear and coherent during my exam. Good and equal smile. No facial droop noted. He is moving all extremities without difficulty and pulse himself upright in bed for me to listen to his lungs.  Skin: Skin is warm and dry. Capillary refill takes less than 2 seconds. No rash noted. He is not diaphoretic. No erythema. No pallor.  Psychiatric: He has a normal mood and affect. His behavior is normal.  Nursing note and vitals reviewed.    ED Treatments / Results  Labs (all labs ordered are listed, but only  abnormal results are displayed) Labs Reviewed - No data to display  EKG  EKG Interpretation None       Radiology Dg Hand Complete Left  Result Date: 05/18/2017 CLINICAL DATA:  Unwitnessed fall, laceration to the index finger EXAM: LEFT HAND - COMPLETE 3+ VIEW COMPARISON:  None. FINDINGS: No acute displaced fracture or malalignment is seen. No radiopaque foreign body. Advanced arthritic changes at the wrist with flat and appearance of the lunate and triquetrum bones. Cyst in the distal radius. Prominent soft tissue calcifications around the distal ulna. Advanced arthritic changes at the base of the first metacarpal and at the DIP and PIP joints. Prominent soft tissue calcifications about the second through  fifth MCP joints. IMPRESSION: 1. No acute osseous abnormality 2. Prominent arthritis of the digits and wrists. Electronically Signed   By: Jasmine Pang M.D.   On: 05/18/2017 23:03    Procedures .Marland KitchenLaceration Repair Date/Time: 05/19/2017 12:52 AM Performed by: Everlene Farrier Authorized by: Everlene Farrier   Consent:    Consent obtained:  Verbal   Consent given by:  Healthcare agent   Risks discussed:  Infection, pain, poor cosmetic result and need for additional repair Laceration details:    Location:  Hand   Hand location:  L hand, dorsum   Length (cm):  3   Depth (mm):  2 Repair type:    Repair type:  Simple Pre-procedure details:    Preparation:  Imaging obtained to evaluate for foreign bodies Exploration:    Hemostasis achieved with:  Direct pressure   Wound exploration: wound explored through full range of motion   Treatment:    Area cleansed with:  Saline and Shur-Clens   Amount of cleaning:  Extensive Skin repair:    Repair method:  Steri-Strips   Number of Steri-Strips:  3 Approximation:    Approximation:  Close Post-procedure details:    Dressing:  Non-adherent dressing and adhesive bandage   Patient tolerance of procedure:  Tolerated well, no immediate  complications   (including critical care time)  Medications Ordered in ED Medications - No data to display   Initial Impression / Assessment and Plan / ED Course  I have reviewed the triage vital signs and the nursing notes.  Pertinent labs & imaging results that were available during my care of the patient were reviewed by me and considered in my medical decision making (see chart for details).    This is a 83 y.o. Male who presents to the emergency department from his nursing facility after a fall in the bathroom. This was unwitnessed. He was found on the bathroom floor. Patient's daughter who is power of attorney is at bedside. Patient denies any complaints. He is noted to have a laceration to his left hand. Daughter reports that at times his speech has been somewhat different. No fevers have been noted or recent illness. He doesn't relate to the bathroom by himself at home. Long history of dementia. On exam patient is afebrile nontoxic appearing. He does have a laceration noted to the dorsum of his left hand near his second MCP joint. X-ray shows no fracture. No other evidence of trauma or injury. No new abrasions noted. No abrasion noted to his flank. No evidence of any head injury or trauma. His speech is clear and coherent during my exam. He is demented. Patient's daughter and power of attorney is at bedside. We did discuss about the patient's slurred speech per her identification. We discussed that there are options that we can take further to look for signs of infection and/or obtain a head CT. Patient's daughter reports she would not like to do any further imaging or lab tests. She is not sure she would want to do anything with the further information. I think this is reasonable and a good plan. Laceration was repaired by myself with Steri-Strips and tolerated well by the patient. Watch for signs of infection. We'll discharge back to nursing facility. Follow up by primary care.   This  patient was discussed with and evaluated by Dr. Read Drivers who agrees with assessment and plan.   Final Clinical Impressions(s) / ED Diagnoses   Final diagnoses:  Fall, initial encounter  Laceration of left hand  without foreign body, initial encounter    New Prescriptions New Prescriptions   BACITRACIN OINTMENT    Apply 1 application topically 2 (two) times daily.     Everlene Farrier, PA-C 05/19/17 0115    Molpus, Jonny Ruiz, MD 05/19/17 [phone]

## 2017-05-19 NOTE — ED Notes (Signed)
[facility] staff was called and was made aware of pt's discharge and discharge instructions; pt's prescription was also provided.

## 2017-05-19 NOTE — Discharge Instructions (Signed)
Please watch for signs of infection to the laceration on his hand.  Please apply bacitracin ointment to wound after steri strips come off.

## 2017-05-19 NOTE — ED Notes (Signed)
[facility] here to transport pt back to [facility]. 

## 2017-05-27 DIAGNOSIS — F39 Unspecified mood [affective] disorder: Secondary | ICD-10-CM | POA: Diagnosis not present

## 2017-05-27 DIAGNOSIS — F411 Generalized anxiety disorder: Secondary | ICD-10-CM | POA: Diagnosis not present

## 2017-05-27 DIAGNOSIS — G4700 Insomnia, unspecified: Secondary | ICD-10-CM | POA: Diagnosis not present

## 2017-05-27 DIAGNOSIS — R69 Illness, unspecified: Secondary | ICD-10-CM | POA: Diagnosis not present

## 2017-08-03 DIAGNOSIS — E7849 Other hyperlipidemia: Secondary | ICD-10-CM | POA: Diagnosis not present

## 2017-08-03 DIAGNOSIS — L74513 Primary focal hyperhidrosis, soles: Secondary | ICD-10-CM | POA: Diagnosis not present

## 2017-08-03 DIAGNOSIS — R410 Disorientation, unspecified: Secondary | ICD-10-CM | POA: Diagnosis not present

## 2017-08-04 DIAGNOSIS — D649 Anemia, unspecified: Secondary | ICD-10-CM | POA: Diagnosis not present

## 2017-08-04 DIAGNOSIS — N390 Urinary tract infection, site not specified: Secondary | ICD-10-CM | POA: Diagnosis not present

## 2017-08-04 DIAGNOSIS — R5383 Other fatigue: Secondary | ICD-10-CM | POA: Diagnosis not present

## 2017-08-06 DIAGNOSIS — R0989 Other specified symptoms and signs involving the circulatory and respiratory systems: Secondary | ICD-10-CM | POA: Diagnosis not present

## 2017-08-07 DIAGNOSIS — R062 Wheezing: Secondary | ICD-10-CM | POA: Diagnosis not present

## 2017-08-07 DIAGNOSIS — R0602 Shortness of breath: Secondary | ICD-10-CM | POA: Diagnosis not present

## 2017-08-11 DIAGNOSIS — M7989 Other specified soft tissue disorders: Secondary | ICD-10-CM | POA: Diagnosis not present

## 2017-08-15 DIAGNOSIS — G4700 Insomnia, unspecified: Secondary | ICD-10-CM | POA: Diagnosis not present

## 2017-08-15 DIAGNOSIS — R69 Illness, unspecified: Secondary | ICD-10-CM | POA: Diagnosis not present

## 2017-08-15 DIAGNOSIS — R630 Anorexia: Secondary | ICD-10-CM | POA: Diagnosis not present

## 2017-08-15 DIAGNOSIS — F0390 Unspecified dementia without behavioral disturbance: Secondary | ICD-10-CM | POA: Diagnosis not present

## 2017-08-28 DEATH — deceased

## 2017-10-22 IMAGING — DX DG HAND COMPLETE 3+V*L*
3 series · 3 of 3 positions shown · non-contrast
Comparison: None.

CLINICAL DATA: Unwitnessed fall, laceration to the index finger

EXAM:
LEFT HAND - COMPLETE 3+ VIEW

[hand ap]
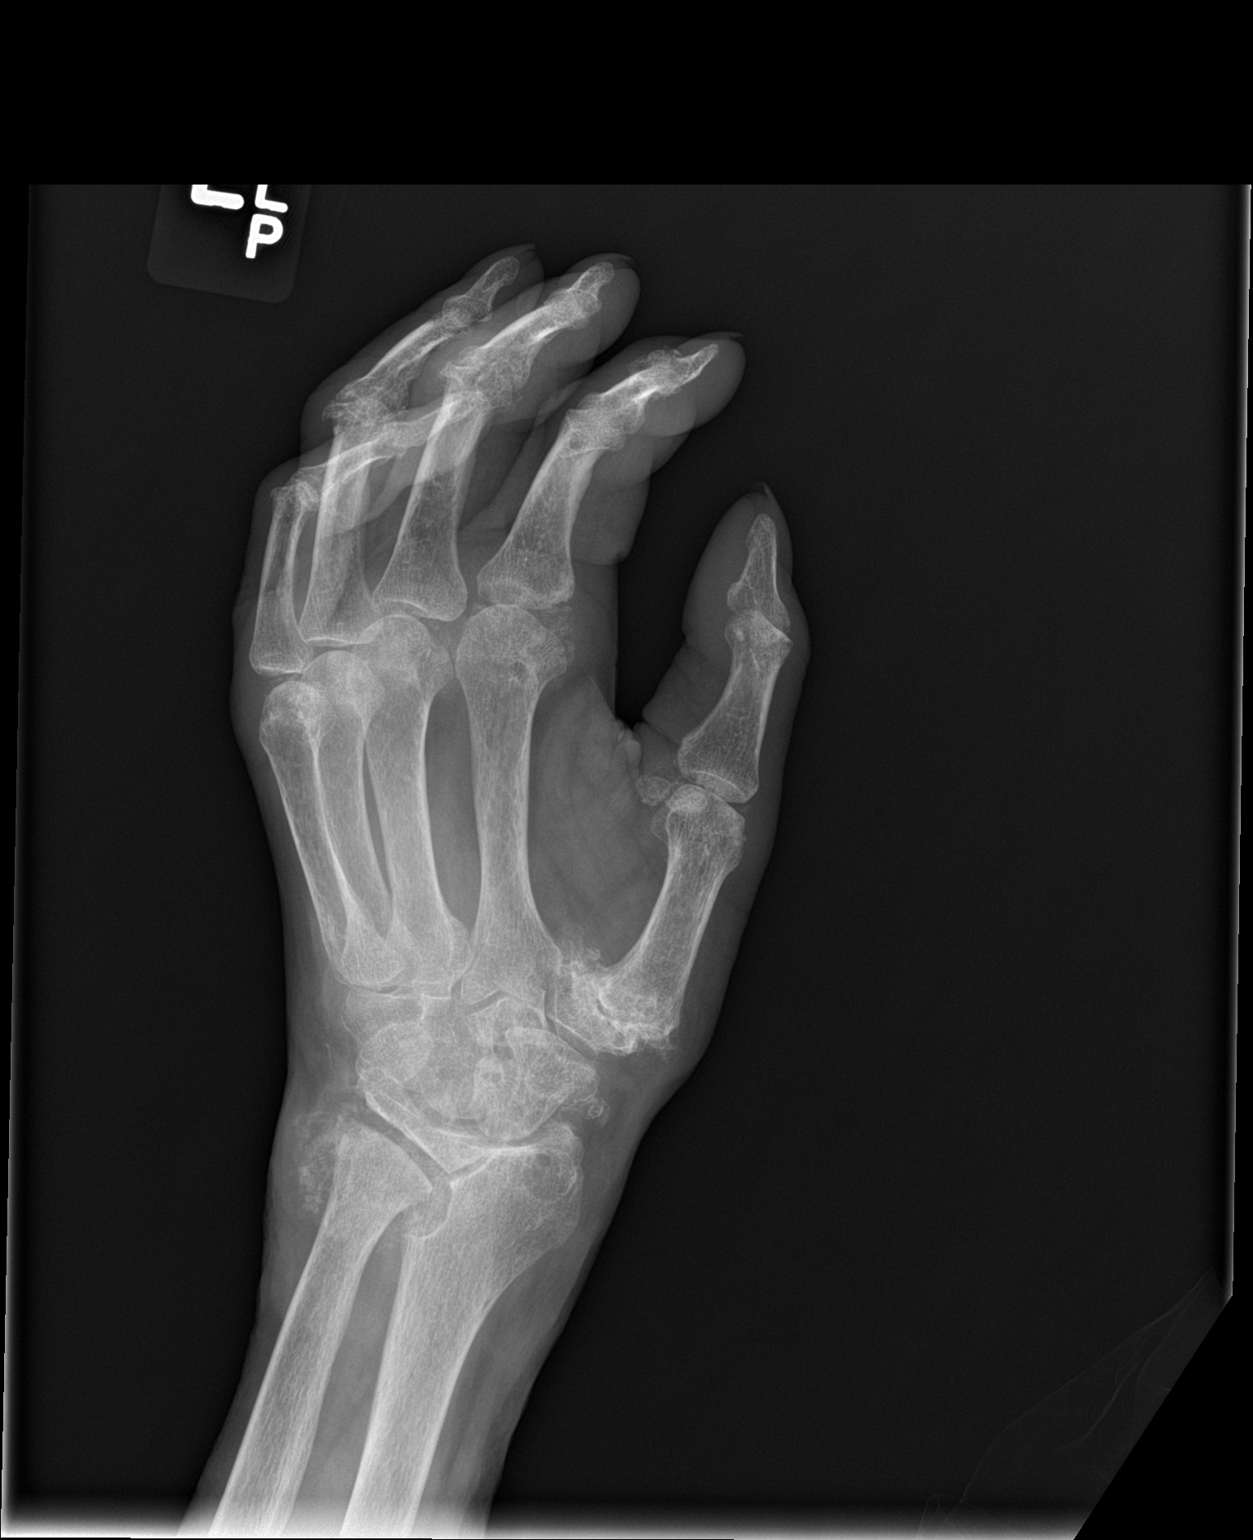

[hand obl]
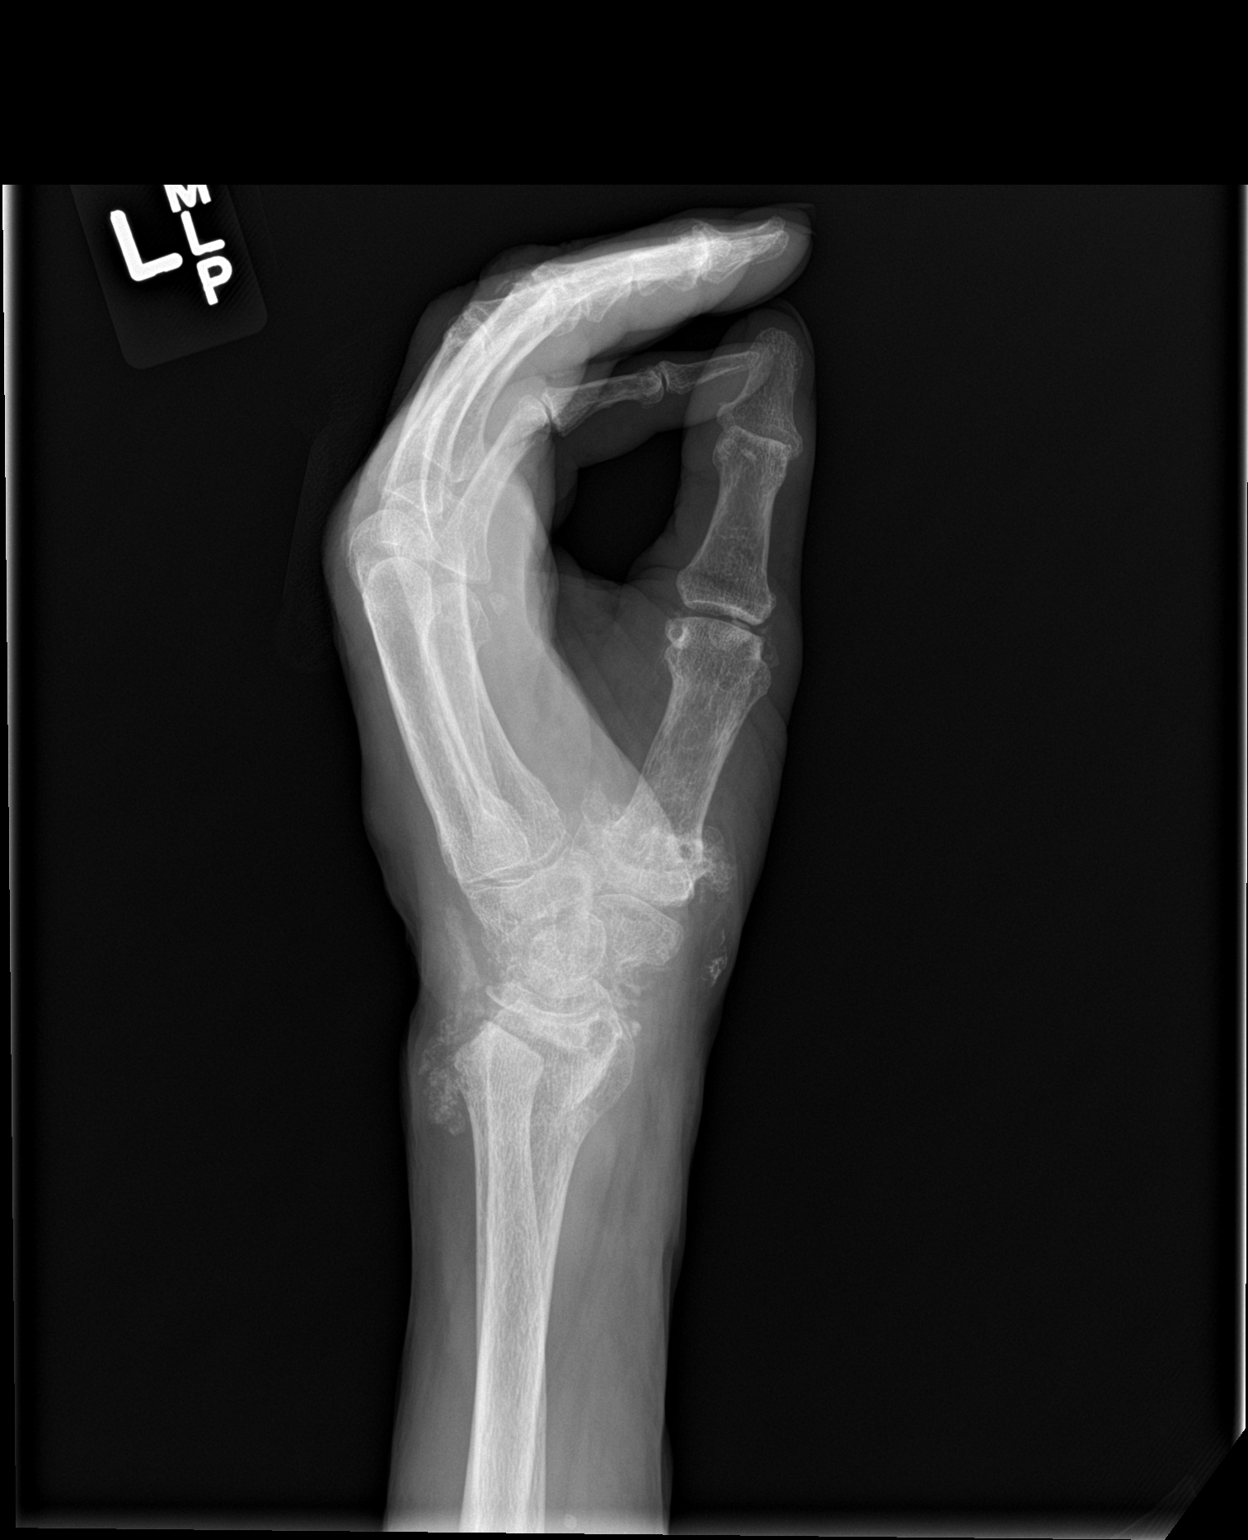

[hand lat]
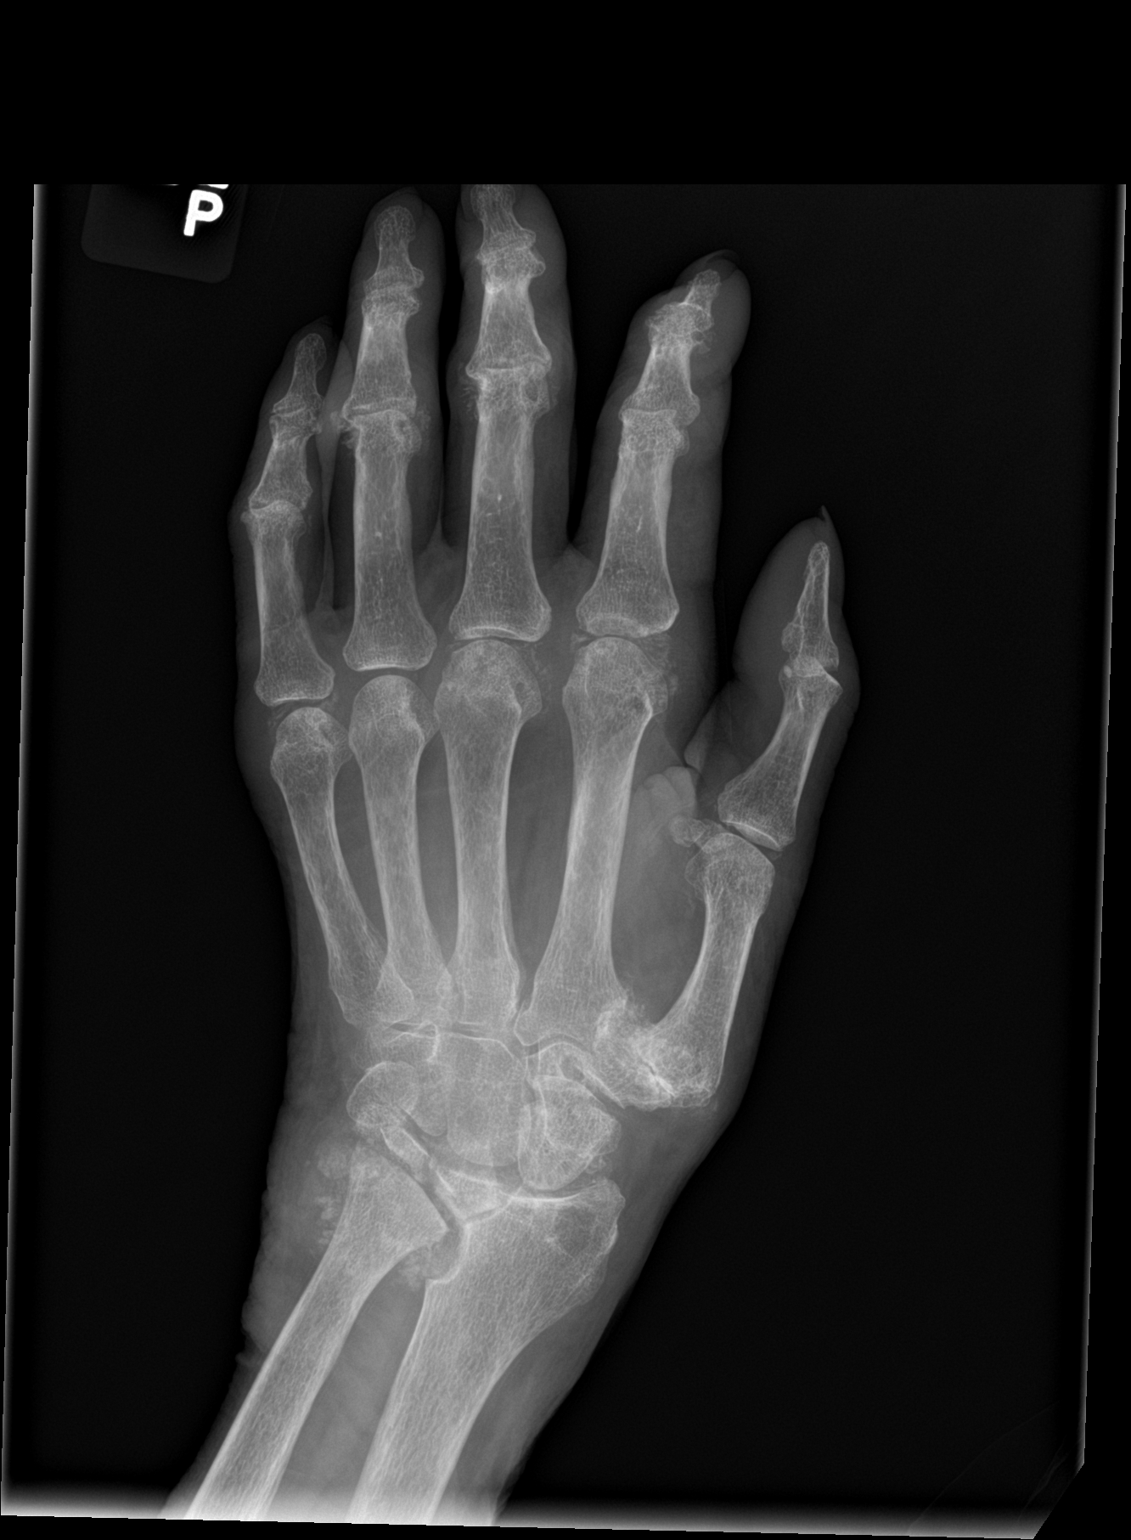

[3 of 3 positions shown; findings below may reference images not displayed]

FINDINGS: No acute displaced fracture or malalignment is seen. No radiopaque
foreign body. Advanced arthritic changes at the wrist with flat and
appearance of the lunate and triquetrum bones. Cyst in the distal
radius. Prominent soft tissue calcifications around the distal ulna.
Advanced arthritic changes at the base of the first metacarpal and
at the DIP and PIP joints. Prominent soft tissue calcifications
about the second through fifth MCP joints.
IMPRESSION: 1. No acute osseous abnormality
2. Prominent arthritis of the digits and wrists.
